# Patient Record
Sex: Female | Born: 1985 | Race: Black or African American | Hispanic: No | Marital: Single | Smoking: Current some day smoker
Health system: Southern US, Community
[De-identification: ages and names within clinical notes are randomized; demographics above are authoritative.]

## PROBLEM LIST (undated history)

## (undated) DIAGNOSIS — N83209 Unspecified ovarian cyst, unspecified side: Secondary | ICD-10-CM

## (undated) DIAGNOSIS — F419 Anxiety disorder, unspecified: Secondary | ICD-10-CM

## (undated) DIAGNOSIS — R42 Dizziness and giddiness: Secondary | ICD-10-CM

## (undated) HISTORY — PX: TUBAL LIGATION: SHX77

## (undated) HISTORY — PX: APPENDECTOMY: SHX54

## (undated) HISTORY — PX: MYOMECTOMY: SHX85

---

## 2009-01-03 ENCOUNTER — Inpatient Hospital Stay (HOSPITAL_COMMUNITY): Admission: AD | Admit: 2009-01-03 | Discharge: 2009-01-03 | Payer: Self-pay | Admitting: Obstetrics & Gynecology

## 2009-01-04 ENCOUNTER — Ambulatory Visit (HOSPITAL_COMMUNITY): Admission: RE | Admit: 2009-01-04 | Discharge: 2009-01-04 | Payer: Self-pay | Admitting: Obstetrics & Gynecology

## 2009-01-04 ENCOUNTER — Ambulatory Visit: Payer: Self-pay | Admitting: Obstetrics & Gynecology

## 2009-01-12 ENCOUNTER — Ambulatory Visit: Payer: Self-pay | Admitting: Family Medicine

## 2009-01-19 ENCOUNTER — Ambulatory Visit: Payer: Self-pay | Admitting: Family Medicine

## 2009-01-24 ENCOUNTER — Inpatient Hospital Stay (HOSPITAL_COMMUNITY): Admission: AD | Admit: 2009-01-24 | Discharge: 2009-01-24 | Payer: Self-pay | Admitting: Obstetrics & Gynecology

## 2009-01-26 ENCOUNTER — Ambulatory Visit: Payer: Self-pay | Admitting: Family Medicine

## 2009-01-26 ENCOUNTER — Ambulatory Visit (HOSPITAL_COMMUNITY): Admission: RE | Admit: 2009-01-26 | Discharge: 2009-01-26 | Payer: Self-pay | Admitting: Obstetrics and Gynecology

## 2009-02-02 ENCOUNTER — Ambulatory Visit: Payer: Self-pay | Admitting: Family Medicine

## 2009-02-09 ENCOUNTER — Ambulatory Visit: Payer: Self-pay | Admitting: Obstetrics & Gynecology

## 2009-02-09 ENCOUNTER — Ambulatory Visit (HOSPITAL_COMMUNITY): Admission: RE | Admit: 2009-02-09 | Discharge: 2009-02-09 | Payer: Self-pay | Admitting: Family Medicine

## 2009-02-16 ENCOUNTER — Ambulatory Visit: Payer: Self-pay | Admitting: Obstetrics and Gynecology

## 2009-02-23 ENCOUNTER — Ambulatory Visit: Payer: Self-pay | Admitting: Obstetrics & Gynecology

## 2009-03-02 ENCOUNTER — Ambulatory Visit: Payer: Self-pay | Admitting: Obstetrics & Gynecology

## 2009-03-04 ENCOUNTER — Inpatient Hospital Stay (HOSPITAL_COMMUNITY): Admission: AD | Admit: 2009-03-04 | Discharge: 2009-03-04 | Payer: Self-pay | Admitting: Obstetrics and Gynecology

## 2009-03-04 ENCOUNTER — Ambulatory Visit: Payer: Self-pay | Admitting: Family

## 2009-03-09 ENCOUNTER — Ambulatory Visit (HOSPITAL_COMMUNITY): Admission: RE | Admit: 2009-03-09 | Discharge: 2009-03-09 | Payer: Self-pay | Admitting: Obstetrics & Gynecology

## 2009-03-09 ENCOUNTER — Ambulatory Visit: Payer: Self-pay | Admitting: Obstetrics and Gynecology

## 2009-03-16 ENCOUNTER — Ambulatory Visit: Payer: Self-pay | Admitting: Obstetrics & Gynecology

## 2009-03-16 ENCOUNTER — Encounter: Payer: Self-pay | Admitting: Family Medicine

## 2009-03-23 ENCOUNTER — Ambulatory Visit: Payer: Self-pay | Admitting: Obstetrics & Gynecology

## 2009-03-30 ENCOUNTER — Ambulatory Visit: Payer: Self-pay | Admitting: Obstetrics & Gynecology

## 2009-03-30 LAB — CONVERTED CEMR LAB
HCT: 35.4 % — ABNORMAL LOW (ref 36.0–46.0)
MCV: 91.7 fL (ref 78.0–100.0)
Platelets: 313 10*3/uL (ref 150–400)
WBC: 8 10*3/uL (ref 4.0–10.5)

## 2009-04-06 ENCOUNTER — Ambulatory Visit: Payer: Self-pay | Admitting: Obstetrics & Gynecology

## 2009-04-13 ENCOUNTER — Ambulatory Visit: Payer: Self-pay | Admitting: Obstetrics & Gynecology

## 2009-04-14 ENCOUNTER — Inpatient Hospital Stay (HOSPITAL_COMMUNITY): Admission: AD | Admit: 2009-04-14 | Discharge: 2009-04-14 | Payer: Self-pay | Admitting: Obstetrics & Gynecology

## 2009-04-20 ENCOUNTER — Ambulatory Visit: Payer: Self-pay | Admitting: Obstetrics & Gynecology

## 2009-04-27 ENCOUNTER — Ambulatory Visit: Payer: Self-pay | Admitting: Obstetrics & Gynecology

## 2009-05-04 ENCOUNTER — Encounter: Payer: Self-pay | Admitting: Obstetrics & Gynecology

## 2009-05-04 ENCOUNTER — Ambulatory Visit: Payer: Self-pay | Admitting: Obstetrics & Gynecology

## 2009-05-04 LAB — CONVERTED CEMR LAB
Alkaline Phosphatase: 146 units/L — ABNORMAL HIGH (ref 39–117)
BUN: 5 mg/dL — ABNORMAL LOW (ref 6–23)
Glucose, Bld: 94 mg/dL (ref 70–99)
MCHC: 33.4 g/dL (ref 30.0–36.0)
MCV: 88 fL (ref 78.0–100.0)
RBC: 3.74 M/uL — ABNORMAL LOW (ref 3.87–5.11)
Sodium: 136 meq/L (ref 135–145)
Total Bilirubin: 0.2 mg/dL — ABNORMAL LOW (ref 0.3–1.2)
Total Protein: 5.8 g/dL — ABNORMAL LOW (ref 6.0–8.3)

## 2009-05-05 ENCOUNTER — Ambulatory Visit: Payer: Self-pay | Admitting: Obstetrics & Gynecology

## 2009-05-05 LAB — CONVERTED CEMR LAB
Collection Interval-CRCL: 24 hr
Creatinine 24 HR UR: 989 mg/24hr (ref 700–1800)
Creatinine Clearance: 196 mL/min — ABNORMAL HIGH (ref 75–115)
Creatinine, Urine: 60.9 mg/dL
Protein, Ur: 81 mg/24hr (ref 50–100)

## 2009-05-08 ENCOUNTER — Ambulatory Visit: Payer: Self-pay | Admitting: Obstetrics & Gynecology

## 2009-05-11 ENCOUNTER — Ambulatory Visit: Payer: Self-pay | Admitting: Family Medicine

## 2009-05-18 ENCOUNTER — Ambulatory Visit: Payer: Self-pay | Admitting: Obstetrics & Gynecology

## 2009-05-24 ENCOUNTER — Ambulatory Visit: Payer: Self-pay | Admitting: Obstetrics and Gynecology

## 2009-05-24 ENCOUNTER — Inpatient Hospital Stay (HOSPITAL_COMMUNITY): Admission: AD | Admit: 2009-05-24 | Discharge: 2009-05-24 | Payer: Self-pay | Admitting: Obstetrics & Gynecology

## 2009-05-30 ENCOUNTER — Ambulatory Visit: Payer: Self-pay | Admitting: Physician Assistant

## 2009-05-31 ENCOUNTER — Inpatient Hospital Stay (HOSPITAL_COMMUNITY): Admission: AD | Admit: 2009-05-31 | Discharge: 2009-06-02 | Payer: Self-pay | Admitting: Obstetrics & Gynecology

## 2009-06-04 ENCOUNTER — Ambulatory Visit: Payer: Self-pay | Admitting: Obstetrics and Gynecology

## 2009-06-04 ENCOUNTER — Inpatient Hospital Stay (HOSPITAL_COMMUNITY): Admission: AD | Admit: 2009-06-04 | Discharge: 2009-06-04 | Payer: Self-pay | Admitting: Obstetrics and Gynecology

## 2009-07-05 ENCOUNTER — Ambulatory Visit: Payer: Self-pay | Admitting: Obstetrics and Gynecology

## 2010-10-27 ENCOUNTER — Emergency Department (HOSPITAL_BASED_OUTPATIENT_CLINIC_OR_DEPARTMENT_OTHER)
Admission: EM | Admit: 2010-10-27 | Discharge: 2010-10-27 | Disposition: A | Discharge status: Home / Self Care | Payer: Medicaid Other | Attending: Emergency Medicine | Admitting: Emergency Medicine

## 2010-10-27 DIAGNOSIS — M549 Dorsalgia, unspecified: Secondary | ICD-10-CM | POA: Insufficient documentation

## 2010-10-27 DIAGNOSIS — N390 Urinary tract infection, site not specified: Secondary | ICD-10-CM | POA: Insufficient documentation

## 2010-10-27 DIAGNOSIS — A5901 Trichomonal vulvovaginitis: Secondary | ICD-10-CM | POA: Insufficient documentation

## 2010-10-27 DIAGNOSIS — F17200 Nicotine dependence, unspecified, uncomplicated: Secondary | ICD-10-CM | POA: Insufficient documentation

## 2010-10-27 LAB — URINALYSIS, ROUTINE W REFLEX MICROSCOPIC
Bilirubin Urine: NEGATIVE
Ketones, ur: NEGATIVE mg/dL
Protein, ur: NEGATIVE mg/dL
Urobilinogen, UA: 0.2 mg/dL (ref 0.0–1.0)

## 2010-10-27 LAB — WET PREP, GENITAL: Yeast Wet Prep HPF POC: NONE SEEN

## 2010-10-28 ENCOUNTER — Emergency Department (HOSPITAL_BASED_OUTPATIENT_CLINIC_OR_DEPARTMENT_OTHER)
Admission: EM | Admit: 2010-10-28 | Discharge: 2010-10-28 | Disposition: A | Discharge status: Home / Self Care | Payer: Medicaid Other | Attending: Emergency Medicine | Admitting: Emergency Medicine

## 2010-10-28 DIAGNOSIS — N72 Inflammatory disease of cervix uteri: Secondary | ICD-10-CM | POA: Insufficient documentation

## 2010-10-28 DIAGNOSIS — M545 Low back pain, unspecified: Secondary | ICD-10-CM | POA: Insufficient documentation

## 2010-10-28 DIAGNOSIS — A6000 Herpesviral infection of urogenital system, unspecified: Secondary | ICD-10-CM | POA: Insufficient documentation

## 2010-10-28 DIAGNOSIS — F17200 Nicotine dependence, unspecified, uncomplicated: Secondary | ICD-10-CM | POA: Insufficient documentation

## 2010-10-29 LAB — GC/CHLAMYDIA PROBE AMP, GENITAL: GC Probe Amp, Genital: NEGATIVE

## 2010-12-14 LAB — CBC
HCT: 30.6 % — ABNORMAL LOW (ref 36.0–46.0)
Hemoglobin: 10.2 g/dL — ABNORMAL LOW (ref 12.0–15.0)
MCHC: 33.5 g/dL (ref 30.0–36.0)
MCV: 89.2 fL (ref 78.0–100.0)
RBC: 3.42 MIL/uL — ABNORMAL LOW (ref 3.87–5.11)
RDW: 12.8 % (ref 11.5–15.5)
RDW: 13.4 % (ref 11.5–15.5)

## 2010-12-14 LAB — URINALYSIS, ROUTINE W REFLEX MICROSCOPIC
Bilirubin Urine: NEGATIVE
Bilirubin Urine: NEGATIVE
Glucose, UA: NEGATIVE mg/dL
Glucose, UA: NEGATIVE mg/dL
Hgb urine dipstick: NEGATIVE
Ketones, ur: NEGATIVE mg/dL
Ketones, ur: NEGATIVE mg/dL
Leukocytes, UA: NEGATIVE
Protein, ur: 30 mg/dL — AB
Specific Gravity, Urine: 1.015 (ref 1.005–1.030)
pH: >9 — ABNORMAL HIGH (ref 5.0–8.0)

## 2010-12-14 LAB — AST: AST: 22 U/L (ref 0–37)

## 2010-12-14 LAB — POCT URINALYSIS DIP (DEVICE)
Hgb urine dipstick: NEGATIVE
Ketones, ur: NEGATIVE mg/dL
Protein, ur: NEGATIVE mg/dL
Specific Gravity, Urine: 1.01 (ref 1.005–1.030)
pH: 8.5 — ABNORMAL HIGH (ref 5.0–8.0)

## 2010-12-14 LAB — RPR: RPR Ser Ql: NONREACTIVE

## 2010-12-15 LAB — URINE MICROSCOPIC-ADD ON

## 2010-12-15 LAB — URINALYSIS, ROUTINE W REFLEX MICROSCOPIC
Bilirubin Urine: NEGATIVE
Hgb urine dipstick: NEGATIVE
Specific Gravity, Urine: 1.025 (ref 1.005–1.030)
Urobilinogen, UA: 0.2 mg/dL (ref 0.0–1.0)
pH: 6 (ref 5.0–8.0)

## 2010-12-15 LAB — POCT URINALYSIS DIP (DEVICE)
Bilirubin Urine: NEGATIVE
Bilirubin Urine: NEGATIVE
Hgb urine dipstick: NEGATIVE
Hgb urine dipstick: NEGATIVE
Ketones, ur: NEGATIVE mg/dL
Ketones, ur: NEGATIVE mg/dL
Protein, ur: 30 mg/dL — AB
Specific Gravity, Urine: 1.015 (ref 1.005–1.030)
pH: 8.5 — ABNORMAL HIGH (ref 5.0–8.0)
pH: 6.5 (ref 5.0–8.0)

## 2010-12-16 LAB — POCT URINALYSIS DIP (DEVICE)
Bilirubin Urine: NEGATIVE
Hgb urine dipstick: NEGATIVE
Hgb urine dipstick: NEGATIVE
Ketones, ur: NEGATIVE mg/dL
Nitrite: NEGATIVE
Protein, ur: NEGATIVE mg/dL
Protein, ur: NEGATIVE mg/dL
Specific Gravity, Urine: 1.02 (ref 1.005–1.030)
Specific Gravity, Urine: <=1.005 (ref 1.005–1.030)
Urobilinogen, UA: 0.2 mg/dL (ref 0.0–1.0)
Urobilinogen, UA: 1 mg/dL (ref 0.0–1.0)
pH: 5 (ref 5.0–8.0)
pH: 7 (ref 5.0–8.0)

## 2010-12-17 LAB — POCT URINALYSIS DIP (DEVICE)
Bilirubin Urine: NEGATIVE
Glucose, UA: NEGATIVE mg/dL
Hgb urine dipstick: NEGATIVE
Ketones, ur: NEGATIVE mg/dL
Nitrite: NEGATIVE
Protein, ur: NEGATIVE mg/dL
Protein, ur: NEGATIVE mg/dL
Specific Gravity, Urine: 1.015 (ref 1.005–1.030)
Urobilinogen, UA: 0.2 mg/dL (ref 0.0–1.0)
Urobilinogen, UA: 0.2 mg/dL (ref 0.0–1.0)
pH: 6 (ref 5.0–8.0)
pH: 8 (ref 5.0–8.0)

## 2010-12-17 LAB — URINALYSIS, ROUTINE W REFLEX MICROSCOPIC
Bilirubin Urine: NEGATIVE
Glucose, UA: NEGATIVE mg/dL
Hgb urine dipstick: NEGATIVE
Ketones, ur: NEGATIVE mg/dL
Protein, ur: NEGATIVE mg/dL

## 2010-12-17 LAB — GC/CHLAMYDIA PROBE AMP, GENITAL
Chlamydia, DNA Probe: NEGATIVE
GC Probe Amp, Genital: NEGATIVE

## 2010-12-17 LAB — WET PREP, GENITAL

## 2010-12-18 LAB — POCT URINALYSIS DIP (DEVICE)
Bilirubin Urine: NEGATIVE
Bilirubin Urine: NEGATIVE
Bilirubin Urine: NEGATIVE
Glucose, UA: NEGATIVE mg/dL
Glucose, UA: NEGATIVE mg/dL
Hgb urine dipstick: NEGATIVE
Ketones, ur: NEGATIVE mg/dL
Ketones, ur: NEGATIVE mg/dL
Ketones, ur: NEGATIVE mg/dL
Nitrite: NEGATIVE
Protein, ur: NEGATIVE mg/dL
Specific Gravity, Urine: 1.015 (ref 1.005–1.030)
Specific Gravity, Urine: 1.015 (ref 1.005–1.030)
pH: 7 (ref 5.0–8.0)

## 2010-12-18 LAB — CBC
MCHC: 34.6 g/dL (ref 30.0–36.0)
Platelets: 279 10*3/uL (ref 150–400)
RBC: 3.96 MIL/uL (ref 3.87–5.11)
WBC: 8.5 10*3/uL (ref 4.0–10.5)

## 2010-12-18 LAB — URINALYSIS, ROUTINE W REFLEX MICROSCOPIC
Nitrite: NEGATIVE
Protein, ur: NEGATIVE mg/dL
Specific Gravity, Urine: 1.02 (ref 1.005–1.030)
Urobilinogen, UA: 0.2 mg/dL (ref 0.0–1.0)

## 2010-12-18 LAB — DIFFERENTIAL
Basophils Relative: 0 % (ref 0–1)
Eosinophils Absolute: 0.1 10*3/uL (ref 0.0–0.7)
Lymphs Abs: 0.9 10*3/uL (ref 0.7–4.0)
Monocytes Relative: 10 % (ref 3–12)
Neutro Abs: 6.6 10*3/uL (ref 1.7–7.7)
Neutrophils Relative %: 78 % — ABNORMAL HIGH (ref 43–77)

## 2010-12-19 LAB — URINALYSIS, ROUTINE W REFLEX MICROSCOPIC
Glucose, UA: NEGATIVE mg/dL
Hgb urine dipstick: NEGATIVE
Protein, ur: NEGATIVE mg/dL
Specific Gravity, Urine: <1.005 — ABNORMAL LOW (ref 1.005–1.030)
pH: 5.5 (ref 5.0–8.0)

## 2010-12-19 LAB — URINE MICROSCOPIC-ADD ON
RBC / HPF: NONE SEEN RBC/hpf (ref <3)
WBC, UA: NONE SEEN WBC/hpf (ref <3)

## 2010-12-19 LAB — POCT URINALYSIS DIP (DEVICE)
Glucose, UA: NEGATIVE mg/dL
Hgb urine dipstick: NEGATIVE
Nitrite: NEGATIVE
Urobilinogen, UA: 0.2 mg/dL (ref 0.0–1.0)
pH: 5.5 (ref 5.0–8.0)

## 2010-12-19 LAB — WET PREP, GENITAL
Clue Cells Wet Prep HPF POC: NONE SEEN
Trich, Wet Prep: NONE SEEN
Yeast Wet Prep HPF POC: NONE SEEN

## 2010-12-19 LAB — URINE CULTURE: Culture: NO GROWTH

## 2011-05-06 ENCOUNTER — Inpatient Hospital Stay (HOSPITAL_COMMUNITY)
Admission: AD | Admit: 2011-05-06 | Discharge: 2011-05-07 | Disposition: A | Discharge status: Home / Self Care | Payer: Medicaid Other | Source: Ambulatory Visit | Attending: Obstetrics and Gynecology | Admitting: Obstetrics and Gynecology

## 2011-05-06 ENCOUNTER — Inpatient Hospital Stay (HOSPITAL_COMMUNITY): Payer: Medicaid Other

## 2011-05-06 ENCOUNTER — Encounter (HOSPITAL_COMMUNITY): Payer: Self-pay | Admitting: *Deleted

## 2011-05-06 DIAGNOSIS — N760 Acute vaginitis: Secondary | ICD-10-CM | POA: Insufficient documentation

## 2011-05-06 DIAGNOSIS — N83209 Unspecified ovarian cyst, unspecified side: Secondary | ICD-10-CM | POA: Insufficient documentation

## 2011-05-06 DIAGNOSIS — A499 Bacterial infection, unspecified: Secondary | ICD-10-CM | POA: Insufficient documentation

## 2011-05-06 DIAGNOSIS — B9689 Other specified bacterial agents as the cause of diseases classified elsewhere: Secondary | ICD-10-CM | POA: Insufficient documentation

## 2011-05-06 DIAGNOSIS — M545 Low back pain, unspecified: Secondary | ICD-10-CM | POA: Insufficient documentation

## 2011-05-06 HISTORY — DX: Unspecified ovarian cyst, unspecified side: N83.209

## 2011-05-06 LAB — URINALYSIS, ROUTINE W REFLEX MICROSCOPIC
Bilirubin Urine: NEGATIVE
Hgb urine dipstick: NEGATIVE
Nitrite: NEGATIVE
Protein, ur: NEGATIVE mg/dL
Urobilinogen, UA: 0.2 mg/dL (ref 0.0–1.0)

## 2011-05-06 LAB — CBC
HCT: 43.1 % (ref 36.0–46.0)
MCHC: 33.9 g/dL (ref 30.0–36.0)
MCV: 92.9 fL (ref 78.0–100.0)
RDW: 12.6 % (ref 11.5–15.5)

## 2011-05-06 LAB — WET PREP, GENITAL
Trich, Wet Prep: NONE SEEN
Yeast Wet Prep HPF POC: NONE SEEN

## 2011-05-06 MED ORDER — KETOROLAC TROMETHAMINE 10 MG PO TABS: 10.0000 mg | ORAL_TABLET | Freq: Four times a day (QID) | ORAL | Status: AC | PRN

## 2011-05-06 MED ORDER — METRONIDAZOLE 500 MG PO TABS: 500.0000 mg | ORAL_TABLET | Freq: Three times a day (TID) | ORAL | Status: AC

## 2011-05-06 MED ORDER — OXYCODONE-ACETAMINOPHEN 5-325 MG PO TABS
2.0000 | ORAL_TABLET | Freq: Once | ORAL | Status: AC
  Administered 2011-05-06: 2 via ORAL
  Filled 2011-05-06: qty 2

## 2011-05-06 MED ORDER — OXYCODONE-ACETAMINOPHEN 5-325 MG PO TABS: 1.0000 | ORAL_TABLET | ORAL | Status: AC | PRN

## 2011-05-06 MED ORDER — KETOROLAC TROMETHAMINE 60 MG/2ML IM SOLN
60.0000 mg | Freq: Once | INTRAMUSCULAR | Status: AC
  Administered 2011-05-06: 60 mg via INTRAMUSCULAR
  Filled 2011-05-06: qty 2

## 2011-05-06 NOTE — Progress Notes (Signed)
EMS arrival, cannot move due to pain, walking and sudden onset of back pain, hx ovarian cyst, ? Onset menses 0900 then stopped, tubal 2010, lower back tender to palpation, taken to br via stretcher for Urine, allergic to lub so did not want to cath.  Able to ambulate with assistance

## 2011-05-06 NOTE — ED Provider Notes (Signed)
History   Sudden onset of bilat LBP that radiated upward, suprapubic pain, hot flashes and vomiting this evening. Family called EMS because pt couldn't move and stated she was in severe pain 10/10. LMP 04/16/11. But had mod VB x a few hours today that stopped. No recent IC. Hx ovarian cysts. Similar Sx, but different location of pain. Has not taken anything for pain.  CSN: [id number] Arrival date & time: 05/06/2011  7:49 PM  Chief Complaint  Patient presents with  . Back Pain   HPI  Past Medical History  Diagnosis Date  . Ovarian cyst     Past Surgical History  Procedure Date  . Appendectomy   . Myomectomy   . Tubal ligation     No family history on file.  History  Substance Use Topics  . Smoking status: Current Everyday Smoker -- 0.2 packs/day    Types: Cigarettes, Pipe  . Smokeless tobacco: Not on file  . Alcohol Use: No    OB History    Grav Para Term Preterm Abortions TAB SAB Ect Mult Living   5 4 2 2 1  1   2       Review of Systems  All other systems reviewed and are negative.    Physical Exam  BP 96/66  Pulse 67  Temp(Src) 98.7 F (37.1 C) (Oral)  Resp 20  LMP 04/16/2011  Physical Exam  Constitutional: She is oriented to person, place, and time. She appears well-developed and well-nourished. She appears distressed (curled up in fetal position. Great difficulty w/ ambulation on arrival.).  Eyes: Pupils are equal, round, and reactive to light.  Cardiovascular: Normal rate.   Pulmonary/Chest: Effort normal.  Abdominal: Soft. Bowel sounds are normal. She exhibits no distension and no mass. There is tenderness (mod suprapubic tenderness). There is no rebound and no guarding.  Genitourinary: Uterus is tender (mild). Uterus is not enlarged. Cervix exhibits friability (at transformation zone, ? small polyp). Cervix exhibits no motion tenderness and no discharge. Right adnexum displays no mass, no tenderness and no fullness. Left adnexum displays no mass, no  tenderness and no fullness. There is bleeding (scant, tan VB, malodorous) around the vagina.  Musculoskeletal: Normal range of motion.       Lumbar back: She exhibits tenderness (mild).  Neurological: She is alert and oriented to person, place, and time. She has normal strength and normal reflexes. A sensory deficit (decreased sensation over sacrum) is present. She exhibits normal muscle tone. Gait normal.   Results for orders placed during the hospital encounter of 05/06/11 (from the past 24 hour(s))  URINALYSIS, ROUTINE W REFLEX MICROSCOPIC     Status: Normal   Collection Time   05/06/11  8:01 PM      Component Value Range   Color, Urine YELLOW  YELLOW    Appearance CLEAR  CLEAR    Specific Gravity, Urine 1.010  1.005 - 1.030    pH 5.5  5.0 - 8.0    Glucose, UA NEGATIVE  NEGATIVE (mg/dL)   Hgb urine dipstick NEGATIVE  NEGATIVE    Bilirubin Urine NEGATIVE  NEGATIVE    Ketones, ur NEGATIVE  NEGATIVE (mg/dL)   Protein, ur NEGATIVE  NEGATIVE (mg/dL)   Urobilinogen, UA 0.2  0.0 - 1.0 (mg/dL)   Nitrite NEGATIVE  NEGATIVE    Leukocytes, UA NEGATIVE  NEGATIVE   POCT PREGNANCY, URINE     Status: Normal   Collection Time   05/06/11  8:02 PM      Component  Value Range   Preg Test, Ur NEGATIVE    CBC     Status: Normal   Collection Time   05/06/11  8:52 PM      Component Value Range   WBC 8.1  4.0 - 10.5 (K/uL)   RBC 4.64  3.87 - 5.11 (MIL/uL)   Hemoglobin 14.6  12.0 - 15.0 (g/dL)   HCT 16.1  09.6 - 04.5 (%)   MCV 92.9  78.0 - 100.0 (fL)   MCH 31.5  26.0 - 34.0 (pg)   MCHC 33.9  30.0 - 36.0 (g/dL)   RDW 40.9  81.1 - 91.4 (%)   Platelets 368  150 - 400 (K/uL)  WET PREP, GENITAL     Status: Abnormal   Collection Time   05/06/11 10:25 PM      Component Value Range   Yeast, Wet Prep NONE SEEN  NONE SEEN    Trich, Wet Prep NONE SEEN  NONE SEEN    Clue Cells, Wet Prep FEW (*) NONE SEEN    WBC, Wet Prep HPF POC MANY (*) NONE SEEN   US Transvaginal Non-ob  05/06/2011  *RADIOLOGY REPORT*   Clinical Data: Pelvic pain.  TRANSABDOMINAL AND TRANSVAGINAL ULTRASOUND OF PELVIS Technique:  Both transabdominal and transvaginal ultrasound examinations of the pelvis were performed. Transabdominal technique was performed for global imaging of the pelvis including uterus, ovaries, adnexal regions, and pelvic cul-de-sac.  Comparison: 03/09/2009   It was necessary to proceed with endovaginal exam following the transabdominal exam to visualize the endometrium and adnexa.  Findings:  Uterus: The uterus is normal in size and appearance, measuring 6.8 x 4.4 x 5.5 cm.  Endometrium: Normal in thickness, measuring 9 mm, contains a trace amount of fluid.  Right ovary:  Normal in size and echogenicity, measures 2.6 x 1.7 x 2.3 cm.  Left ovary: Normal in size and echogenicity, measures 3.50 1.1 x 2.2 cm.  Other findings: Trace free fluid.  IMPRESSION: No evidence of pelvic mass or other significant abnormality.  Trace free fluid is likely physiologic.  Original Report Authenticated By: Waneta Martins, M.D.   US Pelvis Complete  05/06/2011  *RADIOLOGY REPORT*  Clinical Data: Pelvic pain.  TRANSABDOMINAL AND TRANSVAGINAL ULTRASOUND OF PELVIS Technique:  Both transabdominal and transvaginal ultrasound examinations of the pelvis were performed. Transabdominal technique was performed for global imaging of the pelvis including uterus, ovaries, adnexal regions, and pelvic cul-de-sac.  Comparison: 03/09/2009   It was necessary to proceed with endovaginal exam following the transabdominal exam to visualize the endometrium and adnexa.  Findings:  Uterus: The uterus is normal in size and appearance, measuring 6.8 x 4.4 x 5.5 cm.  Endometrium: Normal in thickness, measuring 9 mm, contains a trace amount of fluid.  Right ovary:  Normal in size and echogenicity, measures 2.6 x 1.7 x 2.3 cm.  Left ovary: Normal in size and echogenicity, measures 3.50 1.1 x 2.2 cm.  Other findings: Trace free fluid.  IMPRESSION: No evidence of pelvic  mass or other significant abnormality.  Trace free fluid is likely physiologic.  Original Report Authenticated By: Waneta Martins, M.D.   ED Course  Procedures Assessment: 1. Possible rupture ovarian cyst, pain significantly improved w/ pain meds 2. LBP of unk etiology 3. BV  Plan: 1. Per consult w/ Dr. Jolayne Panther, offered pt D/C home vs transfer to [facility] for further eval of LBP. Pt chooses D/C 2. F/U PRN w/ PCP or orthopedist.  3. Rx Percocet 5/325 #30, Toradol and  Flagyl 4. Contact info given for [facility]

## 2011-05-07 LAB — GC/CHLAMYDIA PROBE AMP, GENITAL: Chlamydia, DNA Probe: NEGATIVE

## 2011-05-09 NOTE — ED Provider Notes (Signed)
Agree with above note.  CONSTANT,PEGGY 05/09/2011 10:43 AM

## 2011-07-03 ENCOUNTER — Emergency Department (HOSPITAL_BASED_OUTPATIENT_CLINIC_OR_DEPARTMENT_OTHER)
Admission: EM | Admit: 2011-07-03 | Discharge: 2011-07-03 | Disposition: A | Discharge status: Home / Self Care | Payer: Medicaid Other | Attending: Emergency Medicine | Admitting: Emergency Medicine

## 2011-07-03 ENCOUNTER — Encounter (HOSPITAL_BASED_OUTPATIENT_CLINIC_OR_DEPARTMENT_OTHER): Payer: Self-pay | Admitting: *Deleted

## 2011-07-03 DIAGNOSIS — F17200 Nicotine dependence, unspecified, uncomplicated: Secondary | ICD-10-CM | POA: Insufficient documentation

## 2011-07-03 DIAGNOSIS — S60569A Insect bite (nonvenomous) of unspecified hand, initial encounter: Secondary | ICD-10-CM | POA: Insufficient documentation

## 2011-07-03 DIAGNOSIS — Y9289 Other specified places as the place of occurrence of the external cause: Secondary | ICD-10-CM | POA: Insufficient documentation

## 2011-07-03 DIAGNOSIS — W57XXXA Bitten or stung by nonvenomous insect and other nonvenomous arthropods, initial encounter: Secondary | ICD-10-CM

## 2011-07-03 MED ORDER — DIPHENHYDRAMINE HCL 50 MG/ML IJ SOLN
25.0000 mg | Freq: Once | INTRAMUSCULAR | Status: DC
  Filled 2011-07-03: qty 1

## 2011-07-03 MED ORDER — METHYLPREDNISOLONE SODIUM SUCC 125 MG IJ SOLR
125.0000 mg | Freq: Once | INTRAMUSCULAR | Status: AC
  Administered 2011-07-03: 125 mg via INTRAMUSCULAR
  Filled 2011-07-03: qty 2

## 2011-07-03 NOTE — ED Notes (Signed)
Pt amb to room 4 with quick steady gait in nad. Pt reports being bitten by spider on her right hand last night, also has 3 other red spots on arm.

## 2011-07-03 NOTE — ED Provider Notes (Signed)
History     CSN: [id number] Arrival date & time: 07/03/2011 12:49 PM   First MD Initiated Contact with Patient 07/03/11 1240      Chief Complaint  Patient presents with  . Insect Bite    (Consider location/radiation/quality/duration/timing/severity/associated sxs/prior treatment) HPI Comments: Pt states that she was bit by something last night and now she has red spots on her hand and arm:pt denies using any medication:pt states that she is unsure of what the insect was that bit her:pt states that the initial bite was on the forearm  The history is provided by the patient. No language interpreter was used.    Past Medical History  Diagnosis Date  . Ovarian cyst     Past Surgical History  Procedure Date  . Appendectomy   . Myomectomy   . Tubal ligation     History reviewed. No pertinent family history.  History  Substance Use Topics  . Smoking status: Current Everyday Smoker -- 0.2 packs/day    Types: Cigarettes, Pipe  . Smokeless tobacco: Not on file  . Alcohol Use: No    OB History    Grav Para Term Preterm Abortions TAB SAB Ect Mult Living   5 4 2 2 1  1   2       Review of Systems  Constitutional: Negative.   Respiratory: Negative.   Cardiovascular: Negative.   Skin: Positive for rash.    Allergies  Aspirin  Home Medications   Current Outpatient Rx  Name Route Sig Dispense Refill  . ALBUTEROL SULFATE 1.25 MG/3ML IN NEBU Nebulization Take 1 ampule by nebulization every 6 (six) hours as needed.      . ONE-A-DAY VITACRAVES PO CHEW Oral Chew 1 tablet by mouth daily.        BP 124/82  Pulse 79  Temp(Src) 98.4 F (36.9 C) (Oral)  Resp 18  SpO2 100%  LMP 07/03/2011  Physical Exam  Nursing note and vitals reviewed. Constitutional: She appears well-developed and well-nourished.  HENT:  Head: Normocephalic and atraumatic.  Eyes: Pupils are equal, round, and reactive to light.  Neck: Normal range of motion. Neck supple.  Cardiovascular: Normal  rate and regular rhythm.   Pulmonary/Chest: Effort normal and breath sounds normal.  Musculoskeletal: Normal range of motion.  Neurological: She is alert.  Skin:       Pt has multiple red raised whelps to the right arm and hand:no drainage noted to the area    ED Course  Procedures (including critical care time)  Labs Reviewed - No data to display No results found.   1. Insect bite       MDM  Pt has what appears to be a localized reaction:pt treated here in the er:pt okay to continued benadryl at home   Medical screening examination/treatment/procedure(s) were performed by non-physician practitioner and as supervising physician I was immediately available for consultation/collaboration. Osvaldo Human, M.D.      Teressa Lower, NP 07/03/11 1332  Carleene Cooper III, MD 07/03/11 [phone]

## 2011-08-12 ENCOUNTER — Emergency Department (INDEPENDENT_AMBULATORY_CARE_PROVIDER_SITE_OTHER): Payer: No Typology Code available for payment source

## 2011-08-12 ENCOUNTER — Encounter (HOSPITAL_BASED_OUTPATIENT_CLINIC_OR_DEPARTMENT_OTHER): Payer: Self-pay

## 2011-08-12 ENCOUNTER — Emergency Department (HOSPITAL_BASED_OUTPATIENT_CLINIC_OR_DEPARTMENT_OTHER)
Admission: EM | Admit: 2011-08-12 | Discharge: 2011-08-12 | Disposition: A | Discharge status: Home / Self Care | Payer: No Typology Code available for payment source | Attending: Emergency Medicine | Admitting: Emergency Medicine

## 2011-08-12 DIAGNOSIS — M542 Cervicalgia: Secondary | ICD-10-CM

## 2011-08-12 DIAGNOSIS — Y92410 Unspecified street and highway as the place of occurrence of the external cause: Secondary | ICD-10-CM | POA: Insufficient documentation

## 2011-08-12 DIAGNOSIS — M549 Dorsalgia, unspecified: Secondary | ICD-10-CM | POA: Insufficient documentation

## 2011-08-12 DIAGNOSIS — F17200 Nicotine dependence, unspecified, uncomplicated: Secondary | ICD-10-CM | POA: Insufficient documentation

## 2011-08-12 MED ORDER — HYDROCODONE-ACETAMINOPHEN 5-325 MG PO TABS: 2.0000 | ORAL_TABLET | ORAL | Status: AC | PRN

## 2011-08-12 MED ORDER — IBUPROFEN 800 MG PO TABS: 800.0000 mg | ORAL_TABLET | Freq: Three times a day (TID) | ORAL | Status: AC

## 2011-08-12 NOTE — ED Provider Notes (Signed)
Medical screening examination/treatment/procedure(s) were performed by non-physician practitioner and as supervising physician I was immediately available for consultation/collaboration.   Stacey Maura, MD 08/12/11 2302 

## 2011-08-12 NOTE — ED Notes (Signed)
Patient is resting comfortably. 
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
## 2011-09-23 ENCOUNTER — Encounter (HOSPITAL_BASED_OUTPATIENT_CLINIC_OR_DEPARTMENT_OTHER): Payer: Self-pay | Admitting: Emergency Medicine

## 2011-09-23 ENCOUNTER — Emergency Department (INDEPENDENT_AMBULATORY_CARE_PROVIDER_SITE_OTHER): Payer: Self-pay

## 2011-09-23 ENCOUNTER — Emergency Department (HOSPITAL_BASED_OUTPATIENT_CLINIC_OR_DEPARTMENT_OTHER)
Admission: EM | Admit: 2011-09-23 | Discharge: 2011-09-24 | Disposition: A | Discharge status: Home / Self Care | Payer: Self-pay | Attending: Emergency Medicine | Admitting: Emergency Medicine

## 2011-09-23 DIAGNOSIS — R509 Fever, unspecified: Secondary | ICD-10-CM

## 2011-09-23 DIAGNOSIS — R0989 Other specified symptoms and signs involving the circulatory and respiratory systems: Secondary | ICD-10-CM

## 2011-09-23 DIAGNOSIS — R1110 Vomiting, unspecified: Secondary | ICD-10-CM | POA: Insufficient documentation

## 2011-09-23 DIAGNOSIS — B349 Viral infection, unspecified: Secondary | ICD-10-CM

## 2011-09-23 DIAGNOSIS — J069 Acute upper respiratory infection, unspecified: Secondary | ICD-10-CM | POA: Insufficient documentation

## 2011-09-23 DIAGNOSIS — F17200 Nicotine dependence, unspecified, uncomplicated: Secondary | ICD-10-CM | POA: Insufficient documentation

## 2011-09-23 DIAGNOSIS — R05 Cough: Secondary | ICD-10-CM

## 2011-09-23 DIAGNOSIS — B9789 Other viral agents as the cause of diseases classified elsewhere: Secondary | ICD-10-CM | POA: Insufficient documentation

## 2011-09-23 LAB — BASIC METABOLIC PANEL
BUN: 6 mg/dL (ref 6–23)
CO2: 25 mEq/L (ref 19–32)
Calcium: 9.5 mg/dL (ref 8.4–10.5)
Chloride: 102 mEq/L (ref 96–112)
Creatinine, Ser: 0.7 mg/dL (ref 0.50–1.10)
GFR calc Af Amer: >90 mL/min (ref >90)
GFR calc non Af Amer: >90 mL/min (ref >90)
Glucose, Bld: 104 mg/dL — ABNORMAL HIGH (ref 70–99)
Potassium: 3.4 mEq/L — ABNORMAL LOW (ref 3.5–5.1)
Sodium: 138 mEq/L (ref 135–145)

## 2011-09-23 LAB — URINE MICROSCOPIC-ADD ON

## 2011-09-23 LAB — URINALYSIS, ROUTINE W REFLEX MICROSCOPIC
Bilirubin Urine: NEGATIVE
Glucose, UA: NEGATIVE mg/dL
Specific Gravity, Urine: 1.027 (ref 1.005–1.030)

## 2011-09-23 LAB — CBC
HCT: 39.7 % (ref 36.0–46.0)
Hemoglobin: 13.3 g/dL (ref 12.0–15.0)
MCH: 30.9 pg (ref 26.0–34.0)
MCHC: 33.5 g/dL (ref 30.0–36.0)
MCV: 92.3 fL (ref 78.0–100.0)
RDW: 12.2 % (ref 11.5–15.5)

## 2011-09-23 LAB — DIFFERENTIAL
Basophils Relative: 0 % (ref 0–1)
Eosinophils Absolute: 0 10*3/uL (ref 0.0–0.7)
Lymphocytes Relative: 11 % — ABNORMAL LOW (ref 12–46)
Lymphs Abs: 0.8 10*3/uL (ref 0.7–4.0)
Neutro Abs: 5.7 10*3/uL (ref 1.7–7.7)

## 2011-09-23 LAB — PREGNANCY, URINE: Preg Test, Ur: NEGATIVE

## 2011-09-23 MED ORDER — ACETAMINOPHEN 325 MG PO TABS
650.0000 mg | ORAL_TABLET | Freq: Once | ORAL | Status: AC
  Administered 2011-09-23: 650 mg via ORAL
  Filled 2011-09-23: qty 2

## 2011-09-23 MED ORDER — SODIUM CHLORIDE 0.9 % IV BOLUS (SEPSIS)
1000.0000 mL | Freq: Once | INTRAVENOUS | Status: AC
  Administered 2011-09-23: 1000 mL via INTRAVENOUS

## 2011-09-23 NOTE — ED Provider Notes (Signed)
History  This chart was scribed for Alicia Numbers, MD by Bennett Scrape. This patient was seen in room MH02/MH02 and the patient's care was started at 10:09PM.  CSN: [id number]  Arrival date & time 09/23/11  2039   First MD Initiated Contact with Patient 09/23/11 2154      Chief Complaint  Patient presents with  . Fever  . Cough  . Emesis   Patient is a 26 y.o. female presenting with fever. The history is provided by the patient. No language interpreter was used.  Fever Primary symptoms of the febrile illness include fever, headaches, cough, nausea, vomiting and myalgias. Primary symptoms do not include shortness of breath, abdominal pain, diarrhea, dysuria or rash. The current episode started 2 days ago. This is a new problem. The problem has been gradually worsening.  The fever began 2 days ago. The fever has been unchanged since its onset. The maximum temperature recorded prior to her arrival was unknown.  The headache is not associated with weakness.  The myalgias are not associated with weakness.    Alicia Brown is a 26 y.o. female who presents to the Emergency Department complaining of 2 days of gradual onset, gradually worsening, constant fever with associated sore throat,  productive cough of sputum, nasal congestion, myalgias, nausea, emesis X3 episodes today and increased frequency. Fever was measured 102.4 in the ED. She denies any modifying factors. She has been taking tylenol at home with no improvement in symptoms.  She denies abdominal pain and diarrhea as associated symptoms. She confirms sick contacts at home with similar symptoms. She did not get a flu shot this year. Pt is a smoker.   Past Medical History  Diagnosis Date  . Ovarian cyst     Past Surgical History  Procedure Date  . Appendectomy   . Myomectomy   . Tubal ligation     History reviewed. No pertinent family history.  History  Substance Use Topics  . Smoking status: Current Everyday Smoker --  0.2 packs/day    Types: Cigarettes  . Smokeless tobacco: Not on file  . Alcohol Use: No    OB History    Grav Para Term Preterm Abortions TAB SAB Ect Mult Living   5 4 2 2 1  1   2       Review of Systems  Constitutional: Positive for fever and chills.  HENT: Positive for congestion and sore throat. Negative for rhinorrhea and neck pain.   Eyes: Negative for itching.  Respiratory: Positive for cough. Negative for shortness of breath.   Cardiovascular: Negative for chest pain.  Gastrointestinal: Positive for nausea and vomiting. Negative for abdominal pain and diarrhea.  Genitourinary: Positive for frequency. Negative for dysuria and urgency.  Musculoskeletal: Positive for myalgias. Negative for back pain.  Skin: Negative for rash.  Neurological: Positive for headaches. Negative for weakness.    Allergies  Aspirin  Home Medications   Current Outpatient Rx  Name Route Sig Dispense Refill  . ALBUTEROL SULFATE 1.25 MG/3ML IN NEBU Nebulization Take 1 ampule by nebulization every 6 (six) hours as needed. For shortness of breath and wheezing    . MENTHOL (TOPICAL ANALGESIC) 5 % EX PADS Apply externally Apply 1 patch topically as needed. For pain     . ONE-A-DAY VITACRAVES PO CHEW Oral Chew 1 tablet by mouth daily.        Triage Vitals: BP 111/69  Pulse 125  Temp(Src) 102.4 F (39.1 C) (Oral)  Resp 18  SpO2 99%  Physical Exam  Nursing note and vitals reviewed. Constitutional: She is oriented to person, place, and time. She appears well-developed and well-nourished. No distress.  HENT:  Head: Normocephalic and atraumatic.  Right Ear: Tympanic membrane normal.  Left Ear: Tympanic membrane normal.  Nose: Mucosal edema present.  Mouth/Throat: Posterior oropharyngeal edema and posterior oropharyngeal erythema present. No oropharyngeal exudate.  Eyes: Conjunctivae and EOM are normal. Pupils are equal, round, and reactive to light.  Neck: Normal range of motion.    Cardiovascular: Normal rate, regular rhythm, normal heart sounds and intact distal pulses.  Exam reveals no gallop and no friction rub.   No murmur heard. Pulmonary/Chest: Effort normal. No respiratory distress. She has decreased breath sounds. She has no wheezes. She has no rales.  Abdominal: Soft. Bowel sounds are normal. She exhibits no distension. There is no tenderness. There is no rebound and no guarding.  Musculoskeletal: Normal range of motion.  Neurological: She is alert and oriented to person, place, and time. No cranial nerve deficit. She exhibits normal muscle tone. Coordination normal.  Skin: Skin is warm and dry. No rash noted.  Psychiatric: She has a normal mood and affect.    ED Course  Procedures (including critical care time)  DIAGNOSTIC STUDIES: Oxygen Saturation is 99% on room air, normal by my interpretation.    COORDINATION OF CARE: 10:13PM-Discussed chest x-ray and IV fluids with pt and pt agreed to plan.   Labs Reviewed  DIFFERENTIAL - Abnormal; Notable for the following:    Lymphocytes Relative 11 (*)    Monocytes Relative 16 (*)    Monocytes Absolute 1.2 (*)    All other components within normal limits  BASIC METABOLIC PANEL - Abnormal; Notable for the following:    Potassium 3.4 (*)    Glucose, Bld 104 (*)    All other components within normal limits  URINALYSIS, ROUTINE W REFLEX MICROSCOPIC - Abnormal; Notable for the following:    Color, Urine AMBER (*) BIOCHEMICALS MAY BE AFFECTED BY COLOR   APPearance CLOUDY (*)    Hgb urine dipstick LARGE (*)    Ketones, ur 15 (*)    Protein, ur 30 (*)    Nitrite POSITIVE (*)    Leukocytes, UA TRACE (*)    All other components within normal limits  URINE MICROSCOPIC-ADD ON - Abnormal; Notable for the following:    Squamous Epithelial / LPF FEW (*)    Bacteria, UA MANY (*)    All other components within normal limits  CBC  PREGNANCY, URINE   Dg Chest 2 View  09/23/2011  *RADIOLOGY REPORT*  Clinical Data:  Cough, congestion and fever for 3 days.  CHEST - 2 VIEW  Comparison: Thoracic spine radiographs performed 08/12/2011  Findings: The lungs are well-aerated and clear.  There is no evidence of focal opacification, pleural effusion or pneumothorax.  The heart is normal in size; the mediastinal contour is within normal limits.  No acute osseous abnormalities are seen.  IMPRESSION: No acute cardiopulmonary process seen.  Original Report Authenticated By: Tonia Ghent, M.D.     1. Acute URI   2. Viral disease       MDM  Patient was evaluated for her symptoms with labs and CXR.  There was no leukocytosis, renal insufficiency, or PNA found.  Paitent was rehydrated with IVF and treated for fever with tylenol and cough with tessalon perles.  Patient's vitals improved as did her symptoms.  She was given tamiflu and was discharged with prescription for this as  well as tessalon.  Patient was discharged in good condition.      I personally performed the services described in this documentation, which was scribed in my presence. The recorded information has been reviewed and considered.     Alicia Numbers, MD 09/25/11 [phone]

## 2011-09-23 NOTE — ED Notes (Signed)
Pt with cough, fever, n/v.

## 2011-09-24 MED ORDER — BENZONATATE 100 MG PO CAPS
200.0000 mg | ORAL_CAPSULE | Freq: Once | ORAL | Status: AC
  Administered 2011-09-24: 200 mg via ORAL
  Filled 2011-09-24: qty 2

## 2011-09-24 MED ORDER — BENZONATATE 200 MG PO CAPS: 200.0000 mg | ORAL_CAPSULE | Freq: Three times a day (TID) | ORAL | Status: AC | PRN

## 2011-09-24 MED ORDER — OSELTAMIVIR PHOSPHATE 75 MG PO CAPS
75.0000 mg | ORAL_CAPSULE | Freq: Once | ORAL | Status: AC
  Administered 2011-09-24: 75 mg via ORAL
  Filled 2011-09-24: qty 1

## 2011-09-24 MED ORDER — OSELTAMIVIR PHOSPHATE 75 MG PO CAPS: 75.0000 mg | ORAL_CAPSULE | Freq: Two times a day (BID) | ORAL | Status: AC

## 2012-06-25 ENCOUNTER — Emergency Department (HOSPITAL_BASED_OUTPATIENT_CLINIC_OR_DEPARTMENT_OTHER)
Admission: EM | Admit: 2012-06-25 | Discharge: 2012-06-25 | Disposition: A | Discharge status: Home / Self Care | Payer: Medicaid Other | Attending: Emergency Medicine | Admitting: Emergency Medicine

## 2012-06-25 ENCOUNTER — Encounter (HOSPITAL_BASED_OUTPATIENT_CLINIC_OR_DEPARTMENT_OTHER): Payer: Self-pay | Admitting: Emergency Medicine

## 2012-06-25 ENCOUNTER — Emergency Department (HOSPITAL_BASED_OUTPATIENT_CLINIC_OR_DEPARTMENT_OTHER): Payer: Medicaid Other

## 2012-06-25 DIAGNOSIS — X58XXXA Exposure to other specified factors, initial encounter: Secondary | ICD-10-CM | POA: Insufficient documentation

## 2012-06-25 DIAGNOSIS — IMO0002 Reserved for concepts with insufficient information to code with codable children: Secondary | ICD-10-CM | POA: Insufficient documentation

## 2012-06-25 DIAGNOSIS — F17200 Nicotine dependence, unspecified, uncomplicated: Secondary | ICD-10-CM | POA: Insufficient documentation

## 2012-06-25 DIAGNOSIS — S53409A Unspecified sprain of unspecified elbow, initial encounter: Secondary | ICD-10-CM

## 2012-06-25 MED ORDER — IBUPROFEN 400 MG PO TABS: 400.0000 mg | ORAL_TABLET | Freq: Four times a day (QID) | ORAL | Status: DC | PRN

## 2012-06-25 NOTE — ED Provider Notes (Signed)
History     CSN: [id number]  Arrival date & time 06/25/12  1816   First MD Initiated Contact with Patient 06/25/12 1926      Chief Complaint  Patient presents with  . Arm Pain    (Consider location/radiation/quality/duration/timing/severity/associated sxs/prior treatment) HPI Comments: This is a 26 year old female who presents to the ED with a chief complaint of left elbow pain x 2 days.  The patient has not taken anything for the pain.  The pain radiates throughout her arm if elbow is completely extended or flexed.  She is in mild discomfort.  The history is provided by the patient. No language interpreter was used.    Past Medical History  Diagnosis Date  . Ovarian cyst     Past Surgical History  Procedure Date  . Appendectomy   . Myomectomy   . Tubal ligation     No family history on file.  History  Substance Use Topics  . Smoking status: Current Every Day Smoker -- 0.2 packs/day    Types: Cigarettes  . Smokeless tobacco: Not on file  . Alcohol Use: No    OB History    Grav Para Term Preterm Abortions TAB SAB Ect Mult Living   5 4 2 2 1  1   2       Review of Systems  Constitutional: Negative for fever.  HENT: Negative for rhinorrhea.   Eyes: Negative for visual disturbance.  Respiratory: Negative for shortness of breath.   Cardiovascular: Negative for chest pain.  Gastrointestinal: Negative for abdominal pain.  Genitourinary: Negative for dysuria.  Musculoskeletal:       Left elbow pain  Skin: Negative for rash.  Neurological: Negative for weakness.  Psychiatric/Behavioral: The patient is not nervous/anxious.   All other systems reviewed and are negative.    Allergies  Aspirin  Home Medications   Current Outpatient Rx  Name Route Sig Dispense Refill  . ONE-A-DAY VITACRAVES PO CHEW Oral Chew 1 tablet by mouth daily.      . ALBUTEROL SULFATE 1.25 MG/3ML IN NEBU Nebulization Take 1 ampule by nebulization every 6 (six) hours as needed. For  shortness of breath and wheezing    . IBUPROFEN 400 MG PO TABS Oral Take 1 tablet (400 mg total) by mouth every 6 (six) hours as needed for pain. 30 tablet 0  . MENTHOL (TOPICAL ANALGESIC) 5 % EX PADS Apply externally Apply 1 patch topically as needed. For pain       BP 118/80  Pulse 110  Temp 98.5 F (36.9 C) (Oral)  Resp 18  Ht 5\' 2"  (1.575 m)  Wt 135 lb (61.236 kg)  BMI 24.69 kg/m2  SpO2 100%  LMP 05/31/2012  Physical Exam  Nursing note and vitals reviewed. Constitutional: She is oriented to person, place, and time. She appears well-developed and well-nourished.  HENT:  Head: Normocephalic and atraumatic.  Eyes: Conjunctivae normal and EOM are normal. Pupils are equal, round, and reactive to light.  Neck: Normal range of motion. Neck supple.  Cardiovascular: Normal rate, regular rhythm and normal heart sounds.   Pulmonary/Chest: Effort normal and breath sounds normal.  Abdominal: Soft. Bowel sounds are normal.  Musculoskeletal:       Left elbow full range of motion, strength 4/5 limited secondary to pain. Sensation is intact, good cap refill, no swelling, erythema, or heat from the joint.  Neurological: She is alert and oriented to person, place, and time.  Skin: Skin is warm and dry.  Psychiatric:  She has a normal mood and affect. Her behavior is normal. Judgment and thought content normal.    ED Course  Procedures (including critical care time)  Labs Reviewed - No data to display Dg Elbow Complete Left  06/25/2012  *RADIOLOGY REPORT*  Clinical Data: Lifting injury.  Left elbow pain.  LEFT ELBOW - COMPLETE 3+ VIEW  Comparison: None.  Findings: No fracture.  The elbow joint is normally spaced and aligned.  No joint effusion.  The surrounding soft tissues are unremarkable.  IMPRESSION: Normal left elbow radiographs   Original Report Authenticated By: Domenic Moras, M.D.      1. Elbow sprain       MDM  This is a 26 year old female with left elbow sprain. Have  discussed this patient with Dr. Fredderick Phenix. I am going to discharge the patient with a sling and instructions to followup with orthopedics if her symptoms do not improve. I am also going to give her ibuprofen for pain.  I have also instructed the patient to use ice. Patient is stable and ready for discharge.        Roxy Horseman, PA-C 06/25/12 2015

## 2012-06-25 NOTE — ED Provider Notes (Signed)
Medical screening examination/treatment/procedure(s) were performed by non-physician practitioner and as supervising physician I was immediately available for consultation/collaboration.   Rolan Bucco, MD 06/25/12 [phone]

## 2012-06-25 NOTE — ED Notes (Signed)
Picked up 50lb child yesterday and felt pain in left elbow. Is not able to completely flex or extend elbow without pain and tingling to left fingers and shoulder.

## 2012-08-29 ENCOUNTER — Emergency Department (HOSPITAL_BASED_OUTPATIENT_CLINIC_OR_DEPARTMENT_OTHER)
Admission: EM | Admit: 2012-08-29 | Discharge: 2012-08-29 | Disposition: A | Discharge status: Home / Self Care | Payer: Medicaid Other | Attending: Emergency Medicine | Admitting: Emergency Medicine

## 2012-08-29 ENCOUNTER — Encounter (HOSPITAL_BASED_OUTPATIENT_CLINIC_OR_DEPARTMENT_OTHER): Payer: Self-pay | Admitting: *Deleted

## 2012-08-29 DIAGNOSIS — F17200 Nicotine dependence, unspecified, uncomplicated: Secondary | ICD-10-CM | POA: Insufficient documentation

## 2012-08-29 DIAGNOSIS — Z8742 Personal history of other diseases of the female genital tract: Secondary | ICD-10-CM | POA: Insufficient documentation

## 2012-08-29 DIAGNOSIS — Z79899 Other long term (current) drug therapy: Secondary | ICD-10-CM | POA: Insufficient documentation

## 2012-08-29 DIAGNOSIS — K047 Periapical abscess without sinus: Secondary | ICD-10-CM

## 2012-08-29 MED ORDER — OXYCODONE-ACETAMINOPHEN 5-325 MG PO TABS: 2.0000 | ORAL_TABLET | ORAL | Status: DC | PRN

## 2012-08-29 MED ORDER — OXYCODONE-ACETAMINOPHEN 5-325 MG PO TABS
1.0000 | ORAL_TABLET | Freq: Once | ORAL | Status: AC
  Administered 2012-08-29: 1 via ORAL
  Filled 2012-08-29 (×2): qty 1

## 2012-08-29 MED ORDER — AMOXICILLIN 500 MG PO CAPS: 500.0000 mg | ORAL_CAPSULE | Freq: Two times a day (BID) | ORAL | Status: DC

## 2012-08-29 MED ORDER — AMOXICILLIN 500 MG PO CAPS
500.0000 mg | ORAL_CAPSULE | Freq: Once | ORAL | Status: AC
  Administered 2012-08-29: 500 mg via ORAL
  Filled 2012-08-29: qty 1

## 2012-08-29 NOTE — ED Provider Notes (Signed)
History   This chart was scribed for Richardean Canal, MD scribed by Magnus Sinning. The patient was seen in room MH03/MH03 at 16:46   CSN: [id number]  Arrival date & time 08/29/12  1555   Chief Complaint  Patient presents with  . Facial Swelling    (Consider location/radiation/quality/duration/timing/severity/associated sxs/prior treatment) The history is provided by the patient. No language interpreter was used.   Alicia Brown is a 26 y.o. female who presents to the Emergency Department complaining of gradually worsening moderate right sided facial swelling, onset yesterday with associated facial pain and fever of 100.4 last night. She says she does not have a dentist and that the pain has gradually worsened since yesterday and that she can feel a "pocket" in dental area.  Past Medical History  Diagnosis Date  . Ovarian cyst     Past Surgical History  Procedure Date  . Appendectomy   . Myomectomy   . Tubal ligation     History reviewed. No pertinent family history.  History  Substance Use Topics  . Smoking status: Current Every Day Smoker -- 0.2 packs/day    Types: Cigarettes  . Smokeless tobacco: Not on file  . Alcohol Use: No    OB History    Grav Para Term Preterm Abortions TAB SAB Ect Mult Living   5 4 2 2 1  1   2       Review of Systems  Constitutional: Negative for fever.  HENT: Positive for facial swelling and dental problem.   All other systems reviewed and are negative.    Allergies  Aspirin  Home Medications   Current Outpatient Rx  Name  Route  Sig  Dispense  Refill  . ALBUTEROL SULFATE 1.25 MG/3ML IN NEBU   Nebulization   Take 1 ampule by nebulization every 6 (six) hours as needed. For shortness of breath and wheezing         . IBUPROFEN 400 MG PO TABS   Oral   Take 1 tablet (400 mg total) by mouth every 6 (six) hours as needed for pain.   30 tablet   0   . MENTHOL (TOPICAL ANALGESIC) 5 % EX PADS   Apply externally   Apply 1 patch  topically as needed. For pain          . ONE-A-DAY VITACRAVES PO CHEW   Oral   Chew 1 tablet by mouth daily.             BP 113/58  Pulse 82  Temp 98.7 F (37.1 C) (Oral)  Resp 18  Ht 5\' 2"  (1.575 m)  Wt 130 lb (58.968 kg)  BMI 23.78 kg/m2  SpO2 100%  LMP 08/22/2012  Physical Exam  Nursing note and vitals reviewed. Constitutional: She is oriented to person, place, and time. She appears well-developed and well-nourished. No distress.  HENT:  Head: Normocephalic and atraumatic.  Mouth/Throat:         Parotid gland non-tender and no purulent drainage. Poor dentition overall. Missing R upper molar. R premolar with a large whole in the tooth. Mild swelling around the gums ? Fluctuance.   Eyes: Conjunctivae normal and EOM are normal.  Neck: Neck supple. No tracheal deviation present.  Cardiovascular: Normal rate, regular rhythm and normal heart sounds.   Pulmonary/Chest: Effort normal. No respiratory distress. She has no wheezes. She has no rales.       Lungs clear to auscultation   Abdominal: She exhibits no distension.  Musculoskeletal: Normal  range of motion.  Lymphadenopathy:    She has no cervical adenopathy.  Neurological: She is alert and oriented to person, place, and time. No sensory deficit.  Skin: Skin is warm and dry.  Psychiatric: She has a normal mood and affect. Her behavior is normal.    ED Course  Procedures (including critical care time) 16:47: Physical exam performed Labs Reviewed - No data to display No results found.   No diagnosis found.    MDM  Alicia Brown is a 26 y.o. female here with R upper toothache. She likely has periapical abscess from tooth infection. Will finish a course of amoxicillin and will give percocet prn for pain. She will f/u with her dentist.   I personally performed the services described in this documentation, which was scribed in my presence. The recorded information has been reviewed and is  accurate.        Richardean Canal, MD 08/29/12 [phone]

## 2012-08-29 NOTE — ED Notes (Signed)
Facial swelling and dental pain since yesterday
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
## 2014-03-11 ENCOUNTER — Emergency Department (HOSPITAL_BASED_OUTPATIENT_CLINIC_OR_DEPARTMENT_OTHER)
Admission: EM | Admit: 2014-03-11 | Discharge: 2014-03-12 | Disposition: A | Discharge status: Home / Self Care | Payer: Medicaid Other | Attending: Emergency Medicine | Admitting: Emergency Medicine

## 2014-03-11 ENCOUNTER — Emergency Department (HOSPITAL_BASED_OUTPATIENT_CLINIC_OR_DEPARTMENT_OTHER): Payer: Medicaid Other

## 2014-03-11 ENCOUNTER — Encounter (HOSPITAL_BASED_OUTPATIENT_CLINIC_OR_DEPARTMENT_OTHER): Payer: Self-pay | Admitting: Emergency Medicine

## 2014-03-11 DIAGNOSIS — Z79899 Other long term (current) drug therapy: Secondary | ICD-10-CM | POA: Diagnosis not present

## 2014-03-11 DIAGNOSIS — Z3202 Encounter for pregnancy test, result negative: Secondary | ICD-10-CM | POA: Insufficient documentation

## 2014-03-11 DIAGNOSIS — F17200 Nicotine dependence, unspecified, uncomplicated: Secondary | ICD-10-CM | POA: Diagnosis not present

## 2014-03-11 DIAGNOSIS — R112 Nausea with vomiting, unspecified: Secondary | ICD-10-CM | POA: Diagnosis present

## 2014-03-11 DIAGNOSIS — R109 Unspecified abdominal pain: Secondary | ICD-10-CM | POA: Insufficient documentation

## 2014-03-11 DIAGNOSIS — Z8742 Personal history of other diseases of the female genital tract: Secondary | ICD-10-CM | POA: Insufficient documentation

## 2014-03-11 DIAGNOSIS — N390 Urinary tract infection, site not specified: Secondary | ICD-10-CM

## 2014-03-11 DIAGNOSIS — Z792 Long term (current) use of antibiotics: Secondary | ICD-10-CM | POA: Insufficient documentation

## 2014-03-11 LAB — URINE MICROSCOPIC-ADD ON

## 2014-03-11 LAB — CBC WITH DIFFERENTIAL/PLATELET
Basophils Absolute: 0 10*3/uL (ref 0.0–0.1)
Basophils Relative: 0 % (ref 0–1)
EOS ABS: 0.1 10*3/uL (ref 0.0–0.7)
Eosinophils Relative: 1 % (ref 0–5)
HEMATOCRIT: 42.3 % (ref 36.0–46.0)
Hemoglobin: 14.3 g/dL (ref 12.0–15.0)
LYMPHS ABS: 0.6 10*3/uL — AB (ref 0.7–4.0)
LYMPHS PCT: 6 % — AB (ref 12–46)
MCH: 31.5 pg (ref 26.0–34.0)
MCHC: 33.8 g/dL (ref 30.0–36.0)
MCV: 93.2 fL (ref 78.0–100.0)
MONO ABS: 0.4 10*3/uL (ref 0.1–1.0)
Monocytes Relative: 4 % (ref 3–12)
Neutro Abs: 8 10*3/uL — ABNORMAL HIGH (ref 1.7–7.7)
Neutrophils Relative %: 88 % — ABNORMAL HIGH (ref 43–77)
PLATELETS: 371 10*3/uL (ref 150–400)
RBC: 4.54 MIL/uL (ref 3.87–5.11)
RDW: 12.1 % (ref 11.5–15.5)
WBC: 9.1 10*3/uL (ref 4.0–10.5)

## 2014-03-11 LAB — COMPREHENSIVE METABOLIC PANEL
ALT: 14 U/L (ref 0–35)
ANION GAP: 16 — AB (ref 5–15)
AST: 20 U/L (ref 0–37)
Albumin: 4.8 g/dL (ref 3.5–5.2)
Alkaline Phosphatase: 94 U/L (ref 39–117)
BUN: 7 mg/dL (ref 6–23)
CALCIUM: 9.6 mg/dL (ref 8.4–10.5)
CO2: 22 meq/L (ref 19–32)
CREATININE: 0.7 mg/dL (ref 0.50–1.10)
Chloride: 105 mEq/L (ref 96–112)
GLUCOSE: 92 mg/dL (ref 70–99)
Potassium: 3.7 mEq/L (ref 3.7–5.3)
Sodium: 143 mEq/L (ref 137–147)
TOTAL PROTEIN: 8.1 g/dL (ref 6.0–8.3)
Total Bilirubin: 0.7 mg/dL (ref 0.3–1.2)

## 2014-03-11 LAB — URINALYSIS, ROUTINE W REFLEX MICROSCOPIC
Bilirubin Urine: NEGATIVE
GLUCOSE, UA: NEGATIVE mg/dL
KETONES UR: NEGATIVE mg/dL
Nitrite: POSITIVE — AB
PH: 5.5 (ref 5.0–8.0)
Protein, ur: NEGATIVE mg/dL
Specific Gravity, Urine: 1.027 (ref 1.005–1.030)
Urobilinogen, UA: 1 mg/dL (ref 0.0–1.0)

## 2014-03-11 LAB — PREGNANCY, URINE: PREG TEST UR: NEGATIVE

## 2014-03-11 LAB — D-DIMER, QUANTITATIVE (NOT AT ARMC): D DIMER QUANT: 0.54 ug{FEU}/mL — AB (ref 0.00–0.48)

## 2014-03-11 MED ORDER — SODIUM CHLORIDE 0.9 % IV BOLUS (SEPSIS)
1000.0000 mL | Freq: Once | INTRAVENOUS | Status: AC
  Administered 2014-03-11: 1000 mL via INTRAVENOUS

## 2014-03-11 MED ORDER — ONDANSETRON HCL 4 MG/2ML IJ SOLN
INTRAMUSCULAR | Status: AC
  Filled 2014-03-11: qty 2

## 2014-03-11 MED ORDER — ONDANSETRON HCL 4 MG/2ML IJ SOLN
4.0000 mg | Freq: Once | INTRAMUSCULAR | Status: AC
  Administered 2014-03-11: 4 mg via INTRAVENOUS

## 2014-03-11 MED ORDER — ONDANSETRON 4 MG PO TBDP: 4.0000 mg | ORAL_TABLET | Freq: Three times a day (TID) | ORAL | Status: DC | PRN

## 2014-03-11 MED ORDER — PROMETHAZINE HCL 25 MG PO TABS: 25.0000 mg | ORAL_TABLET | Freq: Four times a day (QID) | ORAL | Status: DC | PRN

## 2014-03-11 MED ORDER — DEXTROSE 5 % IV SOLN
1.0000 g | Freq: Once | INTRAVENOUS | Status: AC
  Administered 2014-03-11: 1 g via INTRAVENOUS

## 2014-03-11 MED ORDER — SODIUM CHLORIDE 0.9 % IV SOLN: INTRAVENOUS | Status: DC

## 2014-03-11 MED ORDER — HYDROMORPHONE HCL PF 1 MG/ML IJ SOLN
0.5000 mg | Freq: Once | INTRAMUSCULAR | Status: AC
  Administered 2014-03-11: 0.5 mg via INTRAVENOUS
  Filled 2014-03-11: qty 1

## 2014-03-11 MED ORDER — CEPHALEXIN 500 MG PO CAPS: 500.0000 mg | ORAL_CAPSULE | Freq: Four times a day (QID) | ORAL | Status: DC

## 2014-03-11 MED ORDER — ONDANSETRON HCL 4 MG/2ML IJ SOLN
4.0000 mg | Freq: Once | INTRAMUSCULAR | Status: AC
  Administered 2014-03-11: 4 mg via INTRAVENOUS
  Filled 2014-03-11: qty 2

## 2014-03-11 MED ORDER — HYDROCODONE-ACETAMINOPHEN 5-325 MG PO TABS: 1.0000 | ORAL_TABLET | Freq: Four times a day (QID) | ORAL | Status: DC | PRN

## 2014-03-11 MED ORDER — CEFTRIAXONE SODIUM 1 G IJ SOLR
INTRAMUSCULAR | Status: AC
  Filled 2014-03-11: qty 10

## 2014-03-11 NOTE — ED Provider Notes (Signed)
CSN: [id number]     Arrival date & time 03/11/14  1715 History  This chart was scribed for Vanetta MuldersScott Saanya Zieske, MD by Evon Slackerrance Branch, ED Scribe. This patient was seen in room MH02/MH02 and the patient's care was started at 8:35 PM.     Chief Complaint  Patient presents with  . Emesis   Patient is a 28 y.o. female presenting with vomiting. The history is provided by the patient. No language interpreter was used.  Emesis Associated symptoms: no abdominal pain, no chills, no diarrhea, no headaches and no sore throat    HPI Comments: Fredia Beetsngela Y Fluegge is a 28 y.o. female who presents to the Emergency Department complaining of emesis x7 onset 1PM. She states she has associated nausea, left sided abdominal pain, vaginal bleeding due to menstrual cycle, and back pain. She states the pain severity is 5/10 currently. She states when vomiting the pain worsens to 8/10. She states she has a history urinary retention syndrome. She denies dysuria, diarrhea, fever, chills, visual disturbance, cough, rhinorrhea, sore throat, chest pain, SOB, ankle edema, rash, or history of bleeding easily.  Past Medical History  Diagnosis Date  . Ovarian cyst    Past Surgical History  Procedure Laterality Date  . Appendectomy    . Myomectomy    . Tubal ligation     No family history on file. History  Substance Use Topics  . Smoking status: Current Every Day Smoker -- 0.25 packs/day    Types: Cigarettes  . Smokeless tobacco: Not on file  . Alcohol Use: No   OB History   Grav Para Term Preterm Abortions TAB SAB Ect Mult Living   5 4 2 2 1  1   2      Review of Systems  Constitutional: Negative for fever and chills.  HENT: Negative for rhinorrhea and sore throat.   Eyes: Negative for visual disturbance.  Respiratory: Negative for cough and shortness of breath.   Cardiovascular: Negative for chest pain.  Gastrointestinal: Positive for nausea and vomiting. Negative for abdominal pain and diarrhea.  Genitourinary:  Positive for vaginal bleeding. Negative for dysuria.  Musculoskeletal: Positive for back pain. Negative for neck pain.  Skin: Negative for rash.  Neurological: Negative for headaches.  Psychiatric/Behavioral: Negative for confusion.    Allergies  Aspirin  Home Medications   Prior to Admission medications   Medication Sig Start Date End Date Taking? Authorizing Provider  albuterol (ACCUNEB) 1.25 MG/3ML nebulizer solution Take 1 ampule by nebulization every 6 (six) hours as needed. For shortness of breath and wheezing    Historical Provider, MD  amoxicillin (AMOXIL) 500 MG capsule Take 1 capsule (500 mg total) by mouth 2 (two) times daily. 08/29/12   Richardean Canalavid H Yao, MD  cephALEXin (KEFLEX) 500 MG capsule Take 1 capsule (500 mg total) by mouth 4 (four) times daily. 03/11/14   Vanetta MuldersScott Johnwesley Lederman, MD  HYDROcodone-acetaminophen (NORCO/VICODIN) 5-325 MG per tablet Take 1-2 tablets by mouth every 6 (six) hours as needed for moderate pain. 03/11/14   Vanetta MuldersScott Shaka Cardin, MD  ibuprofen (ADVIL,MOTRIN) 400 MG tablet Take 1 tablet (400 mg total) by mouth every 6 (six) hours as needed for pain. 06/25/12   Roxy Horsemanobert Browning, PA-C  Menthol, Topical Analgesic, 5 % PADS Apply 1 patch topically as needed. For pain     Historical Provider, MD  Multiple Vitamins-Minerals (ONE-A-DAY VITACRAVES) CHEW Chew 1 tablet by mouth daily.      Historical Provider, MD  ondansetron (ZOFRAN ODT) 4 MG disintegrating tablet Take 1  tablet (4 mg total) by mouth every 8 (eight) hours as needed for nausea or vomiting. 03/11/14   Vanetta Mulders, MD  oxyCODONE-acetaminophen (PERCOCET) 5-325 MG per tablet Take 2 tablets by mouth every 4 (four) hours as needed for pain. 08/29/12   Richardean Canal, MD  promethazine (PHENERGAN) 25 MG tablet Take 1 tablet (25 mg total) by mouth every 6 (six) hours as needed for nausea or vomiting. 03/11/14   Vanetta Mulders, MD   Triage Vitals: BP 113/70  Pulse 83  Temp(Src) 98 F (36.7 C) (Oral)  Resp 20  Ht 5\' 2"  (1.575  m)  Wt 130 lb (58.968 kg)  BMI 23.77 kg/m2  SpO2 100%  LMP 03/09/2014  Physical Exam  Nursing note and vitals reviewed. Constitutional: She is oriented to person, place, and time. She appears well-developed and well-nourished. No distress.  HENT:  Head: Normocephalic and atraumatic.  Eyes: Conjunctivae and EOM are normal.  Neck: Neck supple. No tracheal deviation present.  Cardiovascular: Normal rate, regular rhythm and normal heart sounds.   Pulmonary/Chest: Effort normal and breath sounds normal. No respiratory distress.  Abdominal: Bowel sounds are normal. There is no tenderness. There is CVA tenderness.  Left sided CVA tenderness   Musculoskeletal: Normal range of motion. She exhibits no edema.  Neurological: She is alert and oriented to person, place, and time. No cranial nerve deficit. She exhibits normal muscle tone. Coordination normal.  Skin: Skin is warm and dry.  Psychiatric: She has a normal mood and affect. Her behavior is normal.    ED Course  Procedures (including critical care time) DIAGNOSTIC STUDIES: Oxygen Saturation is 100% on RA, normal by my interpretation.    COORDINATION OF CARE: 8:44 PM-Discussed treatment plan which includes CBC panel, CMP, and UA with pt at bedside and pt agreed to plan.     Labs Review Labs Reviewed  URINALYSIS, ROUTINE W REFLEX MICROSCOPIC - Abnormal; Notable for the following:    Color, Urine AMBER (*)    APPearance CLOUDY (*)    Hgb urine dipstick LARGE (*)    Nitrite POSITIVE (*)    Leukocytes, UA SMALL (*)    All other components within normal limits  URINE MICROSCOPIC-ADD ON - Abnormal; Notable for the following:    Squamous Epithelial / LPF FEW (*)    Bacteria, UA MANY (*)    All other components within normal limits  CBC WITH DIFFERENTIAL - Abnormal; Notable for the following:    Neutrophils Relative % 88 (*)    Neutro Abs 8.0 (*)    Lymphocytes Relative 6 (*)    Lymphs Abs 0.6 (*)    All other components within  normal limits  COMPREHENSIVE METABOLIC PANEL - Abnormal; Notable for the following:    Anion gap 16 (*)    All other components within normal limits  D-DIMER, QUANTITATIVE - Abnormal; Notable for the following:    D-Dimer, Quant 0.54 (*)    All other components within normal limits  URINE CULTURE  PREGNANCY, URINE   Results for orders placed during the hospital encounter of 03/11/14  URINALYSIS, ROUTINE W REFLEX MICROSCOPIC      Result Value Ref Range   Color, Urine AMBER (*) YELLOW   APPearance CLOUDY (*) CLEAR   Specific Gravity, Urine 1.027  1.005 - 1.030   pH 5.5  5.0 - 8.0   Glucose, UA NEGATIVE  NEGATIVE mg/dL   Hgb urine dipstick LARGE (*) NEGATIVE   Bilirubin Urine NEGATIVE  NEGATIVE   Ketones,  ur NEGATIVE  NEGATIVE mg/dL   Protein, ur NEGATIVE  NEGATIVE mg/dL   Urobilinogen, UA 1.0  0.0 - 1.0 mg/dL   Nitrite POSITIVE (*) NEGATIVE   Leukocytes, UA SMALL (*) NEGATIVE  PREGNANCY, URINE      Result Value Ref Range   Preg Test, Ur NEGATIVE  NEGATIVE  URINE MICROSCOPIC-ADD ON      Result Value Ref Range   Squamous Epithelial / LPF FEW (*) RARE   WBC, UA 7-10  <3 WBC/hpf   RBC / HPF 3-6  <3 RBC/hpf   Bacteria, UA MANY (*) RARE   Urine-Other MUCOUS PRESENT    CBC WITH DIFFERENTIAL      Result Value Ref Range   WBC 9.1  4.0 - 10.5 K/uL   RBC 4.54  3.87 - 5.11 MIL/uL   Hemoglobin 14.3  12.0 - 15.0 g/dL   HCT 16.1  09.6 - 04.5 %   MCV 93.2  78.0 - 100.0 fL   MCH 31.5  26.0 - 34.0 pg   MCHC 33.8  30.0 - 36.0 g/dL   RDW 40.9  81.1 - 91.4 %   Platelets 371  150 - 400 K/uL   Neutrophils Relative % 88 (*) 43 - 77 %   Neutro Abs 8.0 (*) 1.7 - 7.7 K/uL   Lymphocytes Relative 6 (*) 12 - 46 %   Lymphs Abs 0.6 (*) 0.7 - 4.0 K/uL   Monocytes Relative 4  3 - 12 %   Monocytes Absolute 0.4  0.1 - 1.0 K/uL   Eosinophils Relative 1  0 - 5 %   Eosinophils Absolute 0.1  0.0 - 0.7 K/uL   Basophils Relative 0  0 - 1 %   Basophils Absolute 0.0  0.0 - 0.1 K/uL  COMPREHENSIVE METABOLIC  PANEL      Result Value Ref Range   Sodium 143  137 - 147 mEq/L   Potassium 3.7  3.7 - 5.3 mEq/L   Chloride 105  96 - 112 mEq/L   CO2 22  19 - 32 mEq/L   Glucose, Bld 92  70 - 99 mg/dL   BUN 7  6 - 23 mg/dL   Creatinine, Ser 7.82  0.50 - 1.10 mg/dL   Calcium 9.6  8.4 - 95.6 mg/dL   Total Protein 8.1  6.0 - 8.3 g/dL   Albumin 4.8  3.5 - 5.2 g/dL   AST 20  0 - 37 U/L   ALT 14  0 - 35 U/L   Alkaline Phosphatase 94  39 - 117 U/L   Total Bilirubin 0.7  0.3 - 1.2 mg/dL   GFR calc non Af Amer >90  >90 mL/min   GFR calc Af Amer >90  >90 mL/min   Anion gap 16 (*) 5 - 15  D-DIMER, QUANTITATIVE      Result Value Ref Range   D-Dimer, Quant 0.54 (*) 0.00 - 0.48 ug/mL-FEU     Imaging Review Ct Abdomen Pelvis Wo Contrast  03/11/2014   CLINICAL DATA:  Left flank pain.  Nausea, vomiting.  EXAM: CT ABDOMEN AND PELVIS WITHOUT CONTRAST  TECHNIQUE: Multidetector CT imaging of the abdomen and pelvis was performed following the standard protocol without IV contrast.  COMPARISON:  01/18/2013  FINDINGS: Lung bases are clear.  No effusions.  Heart is normal size.  Liver, gallbladder, spleen, pancreas, adrenals and kidneys are normal. No renal or ureteral stones. No hydronephrosis.  Bilateral tubal ligation clips are noted. Uterus, ovaries and urinary bladder grossly  unremarkable unenhanced appearance. Appendix not definitively seen, but no inflammatory process in the right lower quadrant. Stomach, large and small bowel grossly unremarkable. No acute bony abnormality.  IMPRESSION: Unremarkable noncontrast CT. No evidence of renal or ureteral stones. No hydronephrosis.   Electronically Signed   By: Charlett NoseKevin  Dover M.D.   On: 03/11/2014 21:32     EKG Interpretation None      MDM   Final diagnoses:  Nausea and vomiting in adult  UTI (lower urinary tract infection)   The patient with abnormal urine consistent urinary tract infection. Patient's symptoms seem to be more consistent with pyelonephritis. However no  significant leukocytosis no fever CT scan was done to rule out obstructing stone infection. That was negative. Patient had a lot of left flank pain. Patient given Rocephin here we continued on Keflex patient will be given anti-nausea medicine. Patient will return if not improved in 2 days. Or for any newer worse symptoms in the meantime. Aspirin EC test was negative.  Patient had a d-dimer ordered erroneously. Patient's currently on her menses I think that's why it's mildly elevated patient has no leg swelling no shortness of breath no chest pain. Not going to clinically [place] that elevated d-dimer showing moderately elevated. Patient has no risk factors currently for pulmonary embolus or DVT.      I personally performed the services described in this documentation, which was scribed in my presence. The recorded information has been reviewed and is accurate.       Vanetta MuldersScott Gianah Batt, MD 03/11/14 [phone]

## 2014-03-11 NOTE — ED Notes (Signed)
Vomiting since this afternoon. No pain.

## 2014-03-11 NOTE — Discharge Instructions (Signed)
Take antibiotic as directed until completed. We turn for any new or worse symptoms. Typically for fever or persistent vomiting. On the antibiotic if your symptoms should be improved in 2 days if they're not not working properly needed return. Take pain medication as needed.

## 2014-03-14 LAB — URINE CULTURE

## 2014-03-16 ENCOUNTER — Emergency Department (HOSPITAL_BASED_OUTPATIENT_CLINIC_OR_DEPARTMENT_OTHER)
Admission: EM | Admit: 2014-03-16 | Discharge: 2014-03-16 | Disposition: A | Discharge status: Home / Self Care | Payer: Medicaid Other | Attending: Emergency Medicine | Admitting: Emergency Medicine

## 2014-03-16 ENCOUNTER — Encounter (HOSPITAL_BASED_OUTPATIENT_CLINIC_OR_DEPARTMENT_OTHER): Payer: Self-pay | Admitting: Emergency Medicine

## 2014-03-16 DIAGNOSIS — Z8742 Personal history of other diseases of the female genital tract: Secondary | ICD-10-CM | POA: Insufficient documentation

## 2014-03-16 DIAGNOSIS — Z792 Long term (current) use of antibiotics: Secondary | ICD-10-CM | POA: Diagnosis not present

## 2014-03-16 DIAGNOSIS — F17200 Nicotine dependence, unspecified, uncomplicated: Secondary | ICD-10-CM | POA: Insufficient documentation

## 2014-03-16 DIAGNOSIS — Z3202 Encounter for pregnancy test, result negative: Secondary | ICD-10-CM | POA: Diagnosis not present

## 2014-03-16 DIAGNOSIS — R110 Nausea: Secondary | ICD-10-CM | POA: Insufficient documentation

## 2014-03-16 LAB — CBC WITH DIFFERENTIAL/PLATELET
BASOS ABS: 0 10*3/uL (ref 0.0–0.1)
BASOS PCT: 0 % (ref 0–1)
Eosinophils Absolute: 0.1 10*3/uL (ref 0.0–0.7)
Eosinophils Relative: 2 % (ref 0–5)
HCT: 35.1 % — ABNORMAL LOW (ref 36.0–46.0)
HEMOGLOBIN: 11.6 g/dL — AB (ref 12.0–15.0)
LYMPHS PCT: 21 % (ref 12–46)
Lymphs Abs: 1.4 10*3/uL (ref 0.7–4.0)
MCH: 31.4 pg (ref 26.0–34.0)
MCHC: 33 g/dL (ref 30.0–36.0)
MCV: 94.9 fL (ref 78.0–100.0)
MONOS PCT: 7 % (ref 3–12)
Monocytes Absolute: 0.5 10*3/uL (ref 0.1–1.0)
NEUTROS ABS: 4.6 10*3/uL (ref 1.7–7.7)
NEUTROS PCT: 69 % (ref 43–77)
Platelets: 356 10*3/uL (ref 150–400)
RBC: 3.7 MIL/uL — ABNORMAL LOW (ref 3.87–5.11)
RDW: 11.8 % (ref 11.5–15.5)
WBC: 6.6 10*3/uL (ref 4.0–10.5)

## 2014-03-16 LAB — BASIC METABOLIC PANEL
ANION GAP: 9 (ref 5–15)
BUN: 4 mg/dL — AB (ref 6–23)
CHLORIDE: 110 meq/L (ref 96–112)
CO2: 27 mEq/L (ref 19–32)
Calcium: 9.4 mg/dL (ref 8.4–10.5)
Creatinine, Ser: 0.7 mg/dL (ref 0.50–1.10)
GFR calc non Af Amer: >90 mL/min (ref >90)
Glucose, Bld: 102 mg/dL — ABNORMAL HIGH (ref 70–99)
POTASSIUM: 3.8 meq/L (ref 3.7–5.3)
Sodium: 146 mEq/L (ref 137–147)

## 2014-03-16 LAB — URINALYSIS, ROUTINE W REFLEX MICROSCOPIC
Bilirubin Urine: NEGATIVE
GLUCOSE, UA: NEGATIVE mg/dL
Hgb urine dipstick: NEGATIVE
Ketones, ur: NEGATIVE mg/dL
Nitrite: NEGATIVE
PH: 7.5 (ref 5.0–8.0)
PROTEIN: NEGATIVE mg/dL
SPECIFIC GRAVITY, URINE: 1.006 (ref 1.005–1.030)
Urobilinogen, UA: 0.2 mg/dL (ref 0.0–1.0)

## 2014-03-16 LAB — URINE MICROSCOPIC-ADD ON

## 2014-03-16 LAB — PREGNANCY, URINE: PREG TEST UR: NEGATIVE

## 2014-03-16 MED ORDER — ONDANSETRON 4 MG PO TBDP
4.0000 mg | ORAL_TABLET | Freq: Once | ORAL | Status: AC
  Administered 2014-03-16: 4 mg via ORAL
  Filled 2014-03-16: qty 1

## 2014-03-16 MED ORDER — ONDANSETRON 4 MG PO TBDP: 4.0000 mg | ORAL_TABLET | Freq: Three times a day (TID) | ORAL | Status: DC | PRN

## 2014-03-16 NOTE — ED Provider Notes (Signed)
CSN: [id number]     Arrival date & time 03/16/14  1524 History   First MD Initiated Contact with Patient 03/16/14 1542     Chief Complaint  Patient presents with  . Nausea     (Consider location/radiation/quality/duration/timing/severity/associated sxs/prior Treatment) HPI Comments: Pt is here with c/o nausea without vomiting and left flank pain for the last 5 days. Pt states that she was seen five days ago and had dehydration and a uti. Pt continues to take antibiotic for uti. No fever. No abdominal pain. Pt states that she just hasn't really felt good since she was here.  The history is provided by the patient. No language interpreter was used.    Past Medical History  Diagnosis Date  . Ovarian cyst    Past Surgical History  Procedure Laterality Date  . Appendectomy    . Myomectomy    . Tubal ligation     No family history on file. History  Substance Use Topics  . Smoking status: Current Every Day Smoker -- 0.25 packs/day    Types: Cigarettes  . Smokeless tobacco: Not on file  . Alcohol Use: No   OB History   Grav Para Term Preterm Abortions TAB SAB Ect Mult Living   5 4 2 2 1  1   2      Review of Systems  Constitutional: Negative.   Respiratory: Negative.   Cardiovascular: Negative.       Allergies  Aspirin and Other  Home Medications   Prior to Admission medications   Medication Sig Start Date End Date Taking? Authorizing Provider  albuterol (ACCUNEB) 1.25 MG/3ML nebulizer solution Take 1 ampule by nebulization every 6 (six) hours as needed. For shortness of breath and wheezing    Historical Provider, MD  amoxicillin (AMOXIL) 500 MG capsule Take 1 capsule (500 mg total) by mouth 2 (two) times daily. 08/29/12   Richardean Canalavid H Yao, MD  cephALEXin (KEFLEX) 500 MG capsule Take 1 capsule (500 mg total) by mouth 4 (four) times daily. 03/11/14   Vanetta MuldersScott Zackowski, MD  HYDROcodone-acetaminophen (NORCO/VICODIN) 5-325 MG per tablet Take 1-2 tablets by mouth every 6 (six) hours  as needed for moderate pain. 03/11/14   Vanetta MuldersScott Zackowski, MD  ibuprofen (ADVIL,MOTRIN) 400 MG tablet Take 1 tablet (400 mg total) by mouth every 6 (six) hours as needed for pain. 06/25/12   Roxy Horsemanobert Browning, PA-C  Menthol, Topical Analgesic, 5 % PADS Apply 1 patch topically as needed. For pain     Historical Provider, MD  Multiple Vitamins-Minerals (ONE-A-DAY VITACRAVES) CHEW Chew 1 tablet by mouth daily.      Historical Provider, MD  ondansetron (ZOFRAN ODT) 4 MG disintegrating tablet Take 1 tablet (4 mg total) by mouth every 8 (eight) hours as needed for nausea or vomiting. 03/11/14   Vanetta MuldersScott Zackowski, MD  oxyCODONE-acetaminophen (PERCOCET) 5-325 MG per tablet Take 2 tablets by mouth every 4 (four) hours as needed for pain. 08/29/12   Richardean Canalavid H Yao, MD  promethazine (PHENERGAN) 25 MG tablet Take 1 tablet (25 mg total) by mouth every 6 (six) hours as needed for nausea or vomiting. 03/11/14   Vanetta MuldersScott Zackowski, MD   BP 135/81  Pulse 94  Temp(Src) 98.7 F (37.1 C) (Oral)  Resp 18  Ht 5' 2.5" (1.588 m)  Wt 130 lb (58.968 kg)  BMI 23.38 kg/m2  SpO2 99%  LMP 03/09/2014 Physical Exam  Nursing note and vitals reviewed. Constitutional: She is oriented to person, place, and time. She appears well-developed and well-nourished.  HENT:  Head: Normocephalic and atraumatic.  Cardiovascular: Normal rate and regular rhythm.   Pulmonary/Chest: Effort normal.  Abdominal: Soft. Bowel sounds are normal. There is no tenderness. There is no CVA tenderness.  Musculoskeletal: Normal range of motion.  Neurological: She is alert and oriented to person, place, and time. She exhibits normal muscle tone. Coordination normal.  Skin: Skin is warm and dry.  Psychiatric: She has a normal mood and affect.    ED Course  Procedures (including critical care time) Labs Review Labs Reviewed  URINALYSIS, ROUTINE W REFLEX MICROSCOPIC - Abnormal; Notable for the following:    Leukocytes, UA TRACE (*)    All other components within  normal limits  CBC WITH DIFFERENTIAL - Abnormal; Notable for the following:    RBC 3.70 (*)    Hemoglobin 11.6 (*)    HCT 35.1 (*)    All other components within normal limits  BASIC METABOLIC PANEL - Abnormal; Notable for the following:    Glucose, Bld 102 (*)    BUN 4 (*)    All other components within normal limits  URINE MICROSCOPIC-ADD ON - Abnormal; Notable for the following:    Bacteria, UA MANY (*)    All other components within normal limits  URINE CULTURE  PREGNANCY, URINE    Imaging Review No results found.   EKG Interpretation None      MDM   Final diagnoses:  Nausea    Pt abdomen is benign. Clinically pt doesn't have pyelo. Don't think further imaging is needed again. Pt tolerating po here without any problem. Urine is getting better and culture was sensitive to keflex which she is on. Will send home with zofran    Teressa LowerVrinda Denetria Luevanos, NP 03/16/14 1814

## 2014-03-16 NOTE — ED Provider Notes (Signed)
Medical screening examination/treatment/procedure(s) were performed by non-physician practitioner and as supervising physician I was immediately available for consultation/collaboration.   EKG Interpretation None        Alicia ChurnJohn David Gordon Carlson III, MD 03/16/14 [phone]

## 2014-03-16 NOTE — Discharge Instructions (Signed)

## 2014-03-16 NOTE — ED Notes (Signed)
C/o nausea, left flank pain-seen 5 days ago for n/v/dehydration per pt

## 2014-03-16 NOTE — ED Notes (Signed)
NP at bedside.

## 2014-03-18 ENCOUNTER — Telehealth (HOSPITAL_BASED_OUTPATIENT_CLINIC_OR_DEPARTMENT_OTHER): Payer: Self-pay | Admitting: Emergency Medicine

## 2014-03-18 LAB — URINE CULTURE
Colony Count: NO GROWTH
Culture: NO GROWTH

## 2014-03-18 NOTE — Telephone Encounter (Signed)
Post ED Visit - Positive Culture Follow-up  Culture report reviewed by antimicrobial stewardship pharmacist: []  Wes Dulaney, Pharm.D., BCPS [x]  Celedonio MiyamotoJeremy Frens, Pharm.D., BCPS []  Georgina PillionElizabeth Martin, Pharm.D., BCPS []  Glade SpringMinh Pham, [address] BoulevardPharm.D., BCPS, AAHIVP []  Estella HuskMichelle Turner, Pharm.D., BCPS, AAHIVP  Positive urine culture Treated with Keflex, organism sensitive to the same and no further patient follow-up is required at this time.  Zeb ComfortHolland, Abdou Stocks 03/18/2014, 2:33 PM

## 2014-05-25 ENCOUNTER — Emergency Department (HOSPITAL_BASED_OUTPATIENT_CLINIC_OR_DEPARTMENT_OTHER)
Admission: EM | Admit: 2014-05-25 | Discharge: 2014-05-25 | Disposition: A | Discharge status: Home / Self Care | Payer: Medicaid Other | Attending: Emergency Medicine | Admitting: Emergency Medicine

## 2014-05-25 ENCOUNTER — Encounter (HOSPITAL_BASED_OUTPATIENT_CLINIC_OR_DEPARTMENT_OTHER): Payer: Self-pay | Admitting: Emergency Medicine

## 2014-05-25 DIAGNOSIS — F17200 Nicotine dependence, unspecified, uncomplicated: Secondary | ICD-10-CM | POA: Insufficient documentation

## 2014-05-25 DIAGNOSIS — H109 Unspecified conjunctivitis: Secondary | ICD-10-CM | POA: Insufficient documentation

## 2014-05-25 DIAGNOSIS — Z792 Long term (current) use of antibiotics: Secondary | ICD-10-CM | POA: Insufficient documentation

## 2014-05-25 DIAGNOSIS — Z8742 Personal history of other diseases of the female genital tract: Secondary | ICD-10-CM | POA: Diagnosis not present

## 2014-05-25 DIAGNOSIS — Z791 Long term (current) use of non-steroidal anti-inflammatories (NSAID): Secondary | ICD-10-CM | POA: Diagnosis not present

## 2014-05-25 DIAGNOSIS — H5789 Other specified disorders of eye and adnexa: Secondary | ICD-10-CM | POA: Diagnosis present

## 2014-05-25 MED ORDER — GENTAMICIN SULFATE 0.3 % OP SOLN: 2.0000 [drp] | Freq: Four times a day (QID) | OPHTHALMIC | Status: DC

## 2014-05-25 NOTE — Discharge Instructions (Signed)
Gentamicin eyedrops as prescribed.  Followup with your doctor if not improving in the next 2 days, and return to the ER if your symptoms substantially worsen or change.   Conjunctivitis Conjunctivitis is commonly called "pink eye." Conjunctivitis can be caused by bacterial or viral infection, allergies, or injuries. There is usually redness of the lining of the eye, itching, discomfort, and sometimes discharge. There may be deposits of matter along the eyelids. A viral infection usually causes a watery discharge, while a bacterial infection causes a yellowish, thick discharge. Pink eye is very contagious and spreads by direct contact. You may be given antibiotic eyedrops as part of your treatment. Before using your eye medicine, remove all drainage from the eye by washing gently with warm water and cotton balls. Continue to use the medication until you have awakened 2 mornings in a row without discharge from the eye. Do not rub your eye. This increases the irritation and helps spread infection. Use separate towels from other household members. Wash your hands with soap and water before and after touching your eyes. Use cold compresses to reduce pain and sunglasses to relieve irritation from light. Do not wear contact lenses or wear eye makeup until the infection is gone. SEEK MEDICAL CARE IF:   Your symptoms are not better after 3 days of treatment.  You have increased pain or trouble seeing.  The outer eyelids become very red or swollen. Document Released: 10/03/2004 Document Revised: 11/18/2011 Document Reviewed: 08/26/2005 [facility] Patient Information 2015 [place], [place]. This information is not intended to replace advice given to you by your health care provider. Make sure you discuss any questions you have with your health care provider.

## 2014-05-25 NOTE — ED Notes (Signed)
Reports pain and burning to bilateral eyes x 3 days

## 2014-05-25 NOTE — ED Provider Notes (Signed)
CSN: [id number]     Arrival date & time 05/25/14  0806 History   First MD Initiated Contact with Patient 05/25/14 [phone]     Chief Complaint  Patient presents with  . Eye Drainage     (Consider location/radiation/quality/duration/timing/severity/associated sxs/prior Treatment) HPI Comments: Patient is a 28 year old female who presents with complaints of bilateral eye redness, irritation, and drainage for the past several days. 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 She does not wear contacts and denies any recent exposures. Her eyes were matted shut when she woke up this morning.  Patient is a 28 y.o. female presenting with conjunctivitis. The history is provided by the patient.  Conjunctivitis This is a new problem. The current episode started 2 days ago. The problem occurs constantly. The problem has been gradually worsening. Nothing aggravates the symptoms. Nothing relieves the symptoms. She has tried nothing for the symptoms. The treatment provided no relief.    Past Medical History  Diagnosis Date  . Ovarian cyst    Past Surgical History  Procedure Laterality Date  . Appendectomy    . Myomectomy    . Tubal ligation     No family history on file. History  Substance Use Topics  . Smoking status: Current Every Day Smoker -- 0.25 packs/day    Types: Cigarettes  . Smokeless tobacco: Not on file  . Alcohol Use: No   OB History   Grav Para Term Preterm Abortions TAB SAB Ect Mult Living   Review of Systems  All other systems reviewed and are negative.     Allergies  Aspirin and Other  Home Medications   Prior to Admission medications   Medication Sig Start Date End Date Taking? Authorizing Provider  albuterol (ACCUNEB) 1.25 MG/3ML nebulizer solution Take 1 ampule by nebulization every 6 (six) hours as needed. For shortness of breath and wheezing    Historical Provider, MD  amoxicillin (AMOXIL) 500 MG capsule Take  1 capsule (500 mg total) by mouth 2 (two) times daily. 08/29/12   Richardean Canal, MD  cephALEXin (KEFLEX) 500 MG capsule Take 1 capsule (500 mg total) by mouth 4 (four) times daily. 03/11/14   Vanetta Mulders, MD  HYDROcodone-acetaminophen (NORCO/VICODIN) 5-325 MG per tablet Take 1-2 tablets by mouth every 6 (six) hours as needed for moderate pain. 03/11/14   Vanetta Mulders, MD  ibuprofen (ADVIL,MOTRIN) 400 MG tablet Take 1 tablet (400 mg total) by mouth every 6 (six) hours as needed for pain. 06/25/12   Roxy Horseman, PA-C  Menthol, Topical Analgesic, 5 % PADS Apply 1 patch topically as needed. For pain     Historical Provider, MD  Multiple Vitamins-Minerals (ONE-A-DAY VITACRAVES) CHEW Chew 1 tablet by mouth daily.      Historical Provider, MD  ondansetron (ZOFRAN ODT) 4 MG disintegrating tablet Take 1 tablet (4 mg total) by mouth every 8 (eight) hours as needed for nausea or vomiting. 03/11/14   Vanetta Mulders, MD  ondansetron (ZOFRAN ODT) 4 MG disintegrating tablet Take 1 tablet (4 mg total) by mouth every 8 (eight) hours as needed for nausea or vomiting. 03/16/14   Teressa Lower, NP  oxyCODONE-acetaminophen (PERCOCET) 5-325 MG per tablet Take 2 tablets by mouth every 4 (four) hours as needed for pain. 08/29/12   Richardean Canal, MD  promethazine (PHENERGAN) 25 MG tablet Take 1 tablet (25 mg total) by mouth every 6 (six)  hours as needed for nausea or vomiting. 03/11/14   Vanetta Mulders, MD   BP 109/76  Pulse 88  Temp(Src) 98.8 F (37.1 C) (Oral)  Resp 16  Ht  (1.575 m)  Wt 120 lb (54.432 kg)  BMI 21.94 kg/m2  SpO2 97%  LMP 05/25/2014 Physical Exam  Nursing note and vitals reviewed. Constitutional: She is oriented to person, place, and time. She appears well-developed and well-nourished. No distress.  HENT:  Head: Normocephalic and atraumatic.  Eyes:  The bilateral conjunctiva are injected with a clear to yellow discharge. Bilateral corneas are clear and pupils are reactive.  Neck: Normal  range of motion. Neck supple.  Neurological: She is alert and oriented to person, place, and time.  Skin: Skin is warm and dry. She is not diaphoretic.    ED Course  Procedures (including critical care time) Labs Review Labs Reviewed - No data to display  Imaging Review No results found.   EKG Interpretation None      MDM   Final diagnoses:  None    We'll treat with gentamicin eyedrops for conjunctivitis. To return as needed if worsening or not improving.    Geoffery Lyons, MD 05/25/14 0830

## 2014-07-11 ENCOUNTER — Encounter (HOSPITAL_BASED_OUTPATIENT_CLINIC_OR_DEPARTMENT_OTHER): Payer: Self-pay | Admitting: Emergency Medicine

## 2014-08-30 ENCOUNTER — Emergency Department (HOSPITAL_BASED_OUTPATIENT_CLINIC_OR_DEPARTMENT_OTHER): Payer: Medicaid Other

## 2014-08-30 ENCOUNTER — Encounter (HOSPITAL_BASED_OUTPATIENT_CLINIC_OR_DEPARTMENT_OTHER): Payer: Self-pay | Admitting: Emergency Medicine

## 2014-08-30 ENCOUNTER — Emergency Department (HOSPITAL_BASED_OUTPATIENT_CLINIC_OR_DEPARTMENT_OTHER)
Admission: EM | Admit: 2014-08-30 | Discharge: 2014-08-30 | Disposition: A | Discharge status: Home / Self Care | Payer: Medicaid Other | Attending: Emergency Medicine | Admitting: Emergency Medicine

## 2014-08-30 DIAGNOSIS — Y990 Civilian activity done for income or pay: Secondary | ICD-10-CM | POA: Diagnosis not present

## 2014-08-30 DIAGNOSIS — X58XXXA Exposure to other specified factors, initial encounter: Secondary | ICD-10-CM | POA: Insufficient documentation

## 2014-08-30 DIAGNOSIS — Z79899 Other long term (current) drug therapy: Secondary | ICD-10-CM | POA: Insufficient documentation

## 2014-08-30 DIAGNOSIS — Z8742 Personal history of other diseases of the female genital tract: Secondary | ICD-10-CM | POA: Insufficient documentation

## 2014-08-30 DIAGNOSIS — N390 Urinary tract infection, site not specified: Secondary | ICD-10-CM | POA: Diagnosis not present

## 2014-08-30 DIAGNOSIS — Y9389 Activity, other specified: Secondary | ICD-10-CM | POA: Diagnosis not present

## 2014-08-30 DIAGNOSIS — M545 Low back pain, unspecified: Secondary | ICD-10-CM

## 2014-08-30 DIAGNOSIS — S39012A Strain of muscle, fascia and tendon of lower back, initial encounter: Secondary | ICD-10-CM

## 2014-08-30 DIAGNOSIS — Z3202 Encounter for pregnancy test, result negative: Secondary | ICD-10-CM | POA: Diagnosis not present

## 2014-08-30 DIAGNOSIS — Y9289 Other specified places as the place of occurrence of the external cause: Secondary | ICD-10-CM | POA: Diagnosis not present

## 2014-08-30 DIAGNOSIS — S3992XA Unspecified injury of lower back, initial encounter: Secondary | ICD-10-CM | POA: Diagnosis present

## 2014-08-30 DIAGNOSIS — Z720 Tobacco use: Secondary | ICD-10-CM | POA: Diagnosis not present

## 2014-08-30 LAB — URINALYSIS, ROUTINE W REFLEX MICROSCOPIC
Bilirubin Urine: NEGATIVE
Glucose, UA: NEGATIVE mg/dL
KETONES UR: NEGATIVE mg/dL
NITRITE: NEGATIVE
PH: 6.5 (ref 5.0–8.0)
PROTEIN: NEGATIVE mg/dL
Specific Gravity, Urine: 1.017 (ref 1.005–1.030)
UROBILINOGEN UA: 0.2 mg/dL (ref 0.0–1.0)

## 2014-08-30 LAB — PREGNANCY, URINE: PREG TEST UR: NEGATIVE

## 2014-08-30 LAB — URINE MICROSCOPIC-ADD ON

## 2014-08-30 MED ORDER — IBUPROFEN 800 MG PO TABS: 800.0000 mg | ORAL_TABLET | Freq: Three times a day (TID) | ORAL | Status: DC

## 2014-08-30 MED ORDER — CEPHALEXIN 500 MG PO CAPS: 500.0000 mg | ORAL_CAPSULE | Freq: Four times a day (QID) | ORAL | Status: DC

## 2014-08-30 MED ORDER — IBUPROFEN 800 MG PO TABS
800.0000 mg | ORAL_TABLET | Freq: Once | ORAL | Status: AC
  Administered 2014-08-30: 800 mg via ORAL
  Filled 2014-08-30: qty 1

## 2014-08-30 NOTE — ED Notes (Signed)
Patient reports that she is having pain in her back x 3 days. 1 time has had blood in her urine. The patient reports that the pain started after pulling a box at work

## 2014-08-30 NOTE — ED Notes (Addendum)
C/o middle to lower back pain x 3 days  Denies inj, increased urinary freq,  Denies dc  Has not taken anything for pain

## 2014-08-30 NOTE — Discharge Instructions (Signed)
Take antibiotic to completion for urinary tract infection. Rest, apply ice intermittently for the next 24 hours followed by heat. Avoid heavy lifting or hard physical activity. Take ibuprofen as prescribed for back pain.  Lumbosacral Strain Lumbosacral strain is a strain of any of the parts that make up your lumbosacral vertebrae. Your lumbosacral vertebrae are the bones that make up the lower third of your backbone. Your lumbosacral vertebrae are held together by muscles and tough, fibrous tissue (ligaments).  CAUSES  A sudden blow to your back can cause lumbosacral strain. Also, anything that causes an excessive stretch of the muscles in the low back can cause this strain. This is typically seen when people exert themselves strenuously, fall, lift heavy objects, bend, or crouch repeatedly. RISK FACTORS  Physically demanding work.  Participation in pushing or pulling sports or sports that require a sudden twist of the back (tennis, golf, baseball).  Weight lifting.  Excessive lower back curvature.  Forward-tilted pelvis.  Weak back or abdominal muscles or both.  Tight hamstrings. SIGNS AND SYMPTOMS  Lumbosacral strain may cause pain in the area of your injury or pain that moves (radiates) down your leg.  DIAGNOSIS Your health care provider can often diagnose lumbosacral strain through a physical exam. In some cases, you may need tests such as X-ray exams.  TREATMENT  Treatment for your lower back injury depends on many factors that your clinician will have to evaluate. However, most treatment will include the use of anti-inflammatory medicines. HOME CARE INSTRUCTIONS   Avoid hard physical activities (tennis, racquetball, waterskiing) if you are not in proper physical condition for it. This may aggravate or create problems.  If you have a back problem, avoid sports requiring sudden body movements. Swimming and walking are generally safer activities.  Maintain good  posture.  Maintain a healthy weight.  For acute conditions, you may put ice on the injured area.  Put ice in a plastic bag.  Place a towel between your skin and the bag.  Leave the ice on for 20 minutes, 2-3 times a day.  When the low back starts healing, stretching and strengthening exercises may be recommended. SEEK MEDICAL CARE IF:  Your back pain is getting worse.  You experience severe back pain not relieved with medicines. SEEK IMMEDIATE MEDICAL CARE IF:   You have numbness, tingling, weakness, or problems with the use of your arms or legs.  There is a change in bowel or bladder control.  You have increasing pain in any area of the body, including your belly (abdomen).  You notice shortness of breath, dizziness, or feel faint.  You feel sick to your stomach (nauseous), are throwing up (vomiting), or become sweaty.  You notice discoloration of your toes or legs, or your feet get very cold. MAKE SURE YOU:   Understand these instructions.  Will watch your condition.  Will get help right away if you are not doing well or get worse. Document Released: 06/05/2005 Document Revised: 08/31/2013 Document Reviewed: 04/14/2013 [facility] Patient Information 2015 [place], [place]. This information is not intended to replace advice given to you by your health care provider. Make sure you discuss any questions you have with your health care provider.  Urinary Tract Infection Urinary tract infections (UTIs) can develop anywhere along your urinary tract. Your urinary tract is your body's drainage system for removing wastes and extra water. Your urinary tract includes two kidneys, two ureters, a bladder, and a urethra. Your kidneys are a pair of bean-shaped organs. Each  kidney is about the size of your fist. They are located below your ribs, one on each side of your spine. CAUSES Infections are caused by microbes, which are microscopic organisms, including fungi, viruses, and bacteria.  These organisms are so small that they can only be seen through a microscope. Bacteria are the microbes that most commonly cause UTIs. SYMPTOMS  Symptoms of UTIs may vary by age and gender of the patient and by the location of the infection. Symptoms in young women typically include a frequent and intense urge to urinate and a painful, burning feeling in the bladder or urethra during urination. Older women and men are more likely to be tired, shaky, and weak and have muscle aches and abdominal pain. A fever may mean the infection is in your kidneys. Other symptoms of a kidney infection include pain in your back or sides below the ribs, nausea, and vomiting. DIAGNOSIS To diagnose a UTI, your caregiver will ask you about your symptoms. Your caregiver also will ask to provide a urine sample. The urine sample will be tested for bacteria and white blood cells. White blood cells are made by your body to help fight infection. TREATMENT  Typically, UTIs can be treated with medication. Because most UTIs are caused by a bacterial infection, they usually can be treated with the use of antibiotics. The choice of antibiotic and length of treatment depend on your symptoms and the type of bacteria causing your infection. HOME CARE INSTRUCTIONS  If you were prescribed antibiotics, take them exactly as your caregiver instructs you. Finish the medication even if you feel better after you have only taken some of the medication.  Drink enough water and fluids to keep your urine clear or pale yellow.  Avoid caffeine, tea, and carbonated beverages. They tend to irritate your bladder.  Empty your bladder often. Avoid holding urine for long periods of time.  Empty your bladder before and after sexual intercourse.  After a bowel movement, women should cleanse from front to back. Use each tissue only once. SEEK MEDICAL CARE IF:   You have back pain.  You develop a fever.  Your symptoms do not begin to resolve within  3 days. SEEK IMMEDIATE MEDICAL CARE IF:   You have severe back pain or lower abdominal pain.  You develop chills.  You have nausea or vomiting.  You have continued burning or discomfort with urination. MAKE SURE YOU:   Understand these instructions.  Will watch your condition.  Will get help right away if you are not doing well or get worse. Document Released: 06/05/2005 Document Revised: 02/25/2012 Document Reviewed: 10/04/2011 [facility] Patient Information 2015 [place], [place]. This information is not intended to replace advice given to you by your health care provider. Make sure you discuss any questions you have with your health care provider.
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
## 2014-10-31 ENCOUNTER — Encounter (HOSPITAL_BASED_OUTPATIENT_CLINIC_OR_DEPARTMENT_OTHER): Payer: Self-pay

## 2014-10-31 ENCOUNTER — Emergency Department (HOSPITAL_BASED_OUTPATIENT_CLINIC_OR_DEPARTMENT_OTHER)
Admission: EM | Admit: 2014-10-31 | Discharge: 2014-10-31 | Disposition: A | Discharge status: Home / Self Care | Payer: No Typology Code available for payment source | Attending: Emergency Medicine | Admitting: Emergency Medicine

## 2014-10-31 DIAGNOSIS — S0083XA Contusion of other part of head, initial encounter: Secondary | ICD-10-CM | POA: Insufficient documentation

## 2014-10-31 DIAGNOSIS — Z720 Tobacco use: Secondary | ICD-10-CM | POA: Insufficient documentation

## 2014-10-31 DIAGNOSIS — Y998 Other external cause status: Secondary | ICD-10-CM | POA: Diagnosis not present

## 2014-10-31 DIAGNOSIS — S60512A Abrasion of left hand, initial encounter: Secondary | ICD-10-CM | POA: Diagnosis not present

## 2014-10-31 DIAGNOSIS — S199XXA Unspecified injury of neck, initial encounter: Secondary | ICD-10-CM | POA: Diagnosis present

## 2014-10-31 DIAGNOSIS — S161XXA Strain of muscle, fascia and tendon at neck level, initial encounter: Secondary | ICD-10-CM | POA: Diagnosis not present

## 2014-10-31 DIAGNOSIS — Y9389 Activity, other specified: Secondary | ICD-10-CM | POA: Diagnosis not present

## 2014-10-31 DIAGNOSIS — Z880 Allergy status to penicillin: Secondary | ICD-10-CM | POA: Insufficient documentation

## 2014-10-31 DIAGNOSIS — H9209 Otalgia, unspecified ear: Secondary | ICD-10-CM | POA: Diagnosis not present

## 2014-10-31 DIAGNOSIS — Z8742 Personal history of other diseases of the female genital tract: Secondary | ICD-10-CM | POA: Diagnosis not present

## 2014-10-31 DIAGNOSIS — Y92410 Unspecified street and highway as the place of occurrence of the external cause: Secondary | ICD-10-CM | POA: Diagnosis not present

## 2014-10-31 MED ORDER — HYDROCODONE-ACETAMINOPHEN 5-325 MG PO TABS: 1.0000 | ORAL_TABLET | ORAL | Status: DC | PRN

## 2014-10-31 MED ORDER — CYCLOBENZAPRINE HCL 10 MG PO TABS: 10.0000 mg | ORAL_TABLET | Freq: Two times a day (BID) | ORAL | Status: DC | PRN

## 2014-10-31 NOTE — Discharge Instructions (Signed)
Abrasion An abrasion is a cut or scrape of the skin. Abrasions do not extend through all layers of the skin and most heal within 10 days. It is important to care for your abrasion properly to prevent infection. CAUSES  Most abrasions are caused by falling on, or gliding across, the ground or other surface. When your skin rubs on something, the outer and inner layer of skin rubs off, causing an abrasion. DIAGNOSIS  Your caregiver will be able to diagnose an abrasion during a physical exam.  TREATMENT  Your treatment depends on how large and deep the abrasion is. Generally, your abrasion will be cleaned with water and a mild soap to remove any dirt or debris. An antibiotic ointment may be put over the abrasion to prevent an infection. A bandage (dressing) may be wrapped around the abrasion to keep it from getting dirty.  You may need a tetanus shot if:  You cannot remember when you had your last tetanus shot.  You have never had a tetanus shot.  The injury broke your skin. If you get a tetanus shot, your arm may swell, get red, and feel warm to the touch. This is common and not a problem. If you need a tetanus shot and you choose not to have one, there is a rare chance of getting tetanus. Sickness from tetanus can be serious.  HOME CARE INSTRUCTIONS   If a dressing was applied, change it at least once a day or as directed by your caregiver. If the bandage sticks, soak it off with warm water.   Wash the area with water and a mild soap to remove all the ointment 2 times a day. Rinse off the soap and pat the area dry with a clean towel.   Reapply any ointment as directed by your caregiver. This will help prevent infection and keep the bandage from sticking. Use gauze over the wound and under the dressing to help keep the bandage from sticking.   Change your dressing right away if it becomes wet or dirty.   Only take over-the-counter or prescription medicines for pain, discomfort, or fever as  directed by your caregiver.   Follow up with your caregiver within 24-48 hours for a wound check, or as directed. If you were not given a wound-check appointment, look closely at your abrasion for redness, swelling, or pus. These are signs of infection. SEEK IMMEDIATE MEDICAL CARE IF:   You have increasing pain in the wound.   You have redness, swelling, or tenderness around the wound.   You have pus coming from the wound.   You have a fever or persistent symptoms for more than 2-3 days.  You have a fever and your symptoms suddenly get worse.  You have a bad smell coming from the wound or dressing.  MAKE SURE YOU:   Understand these instructions.  Will watch your condition.  Will get help right away if you are not doing well or get worse. Document Released: 06/05/2005 Document Revised: 08/12/2012 Document Reviewed: 07/30/2011 [facility] Patient Information 2015 [place], [place]. This information is not intended to replace advice given to you by your health care provider. Make sure you discuss any questions you have with your health care provider.  Cervical Sprain A cervical sprain is an injury in the neck in which the strong, fibrous tissues (ligaments) that connect your neck bones stretch or tear. Cervical sprains can range from mild to severe. Severe cervical sprains can cause the neck vertebrae to be unstable.  This can lead to damage of the spinal cord and can result in serious nervous system problems. The amount of time it takes for a cervical sprain to get better depends on the cause and extent of the injury. Most cervical sprains heal in 1 to 3 weeks. CAUSES  Severe cervical sprains may be caused by:   Contact sport injuries (such as from football, rugby, wrestling, hockey, auto racing, gymnastics, diving, martial arts, or boxing).   Motor vehicle collisions.   Whiplash injuries. This is an injury from a sudden forward and backward whipping movement of the head and  neck.  Falls.  Mild cervical sprains may be caused by:   Being in an awkward position, such as while cradling a telephone between your ear and shoulder.   Sitting in a chair that does not offer proper support.   Working at a poorly Marketing executivedesigned computer station.   Looking up or down for long periods of time.  SYMPTOMS   Pain, soreness, stiffness, or a burning sensation in the front, back, or sides of the neck. This discomfort may develop immediately after the injury or slowly, 24 hours or more after the injury.   Pain or tenderness directly in the middle of the back of the neck.   Shoulder or upper back pain.   Limited ability to move the neck.   Headache.   Dizziness.   Weakness, numbness, or tingling in the hands or arms.   Muscle spasms.   Difficulty swallowing or chewing.   Tenderness and swelling of the neck.  DIAGNOSIS  Most of the time your health care provider can diagnose a cervical sprain by taking your history and doing a physical exam. Your health care provider will ask about previous neck injuries and any known neck problems, such as arthritis in the neck. X-rays may be taken to find out if there are any other problems, such as with the bones of the neck. Other tests, such as a CT scan or MRI, may also be needed.  TREATMENT  Treatment depends on the severity of the cervical sprain. Mild sprains can be treated with rest, keeping the neck in place (immobilization), and pain medicines. Severe cervical sprains are immediately immobilized. Further treatment is done to help with pain, muscle spasms, and other symptoms and may include:  Medicines, such as pain relievers, numbing medicines, or muscle relaxants.   Physical therapy. This may involve stretching exercises, strengthening exercises, and posture training. Exercises and improved posture can help stabilize the neck, strengthen muscles, and help stop symptoms from returning.  HOME CARE INSTRUCTIONS    Put ice on the injured area.   Put ice in a plastic bag.   Place a towel between your skin and the bag.   Leave the ice on for 15-20 minutes, 3-4 times a day.   If your injury was severe, you may have been given a cervical collar to wear. A cervical collar is a two-piece collar designed to keep your neck from moving while it heals.  Do not remove the collar unless instructed by your health care provider.  If you have long hair, keep it outside of the collar.  Ask your health care provider before making any adjustments to your collar. Minor adjustments may be required over time to improve comfort and reduce pressure on your chin or on the back of your head.  Ifyou are allowed to remove the collar for cleaning or bathing, follow your health care provider's instructions on how to do  so safely.  Keep your collar clean by wiping it with mild soap and water and drying it completely. If the collar you have been given includes removable pads, remove them every 1-2 days and hand wash them with soap and water. Allow them to air dry. They should be completely dry before you wear them in the collar.  If you are allowed to remove the collar for cleaning and bathing, wash and dry the skin of your neck. Check your skin for irritation or sores. If you see any, tell your health care provider.  Do not drive while wearing the collar.   Only take over-the-counter or prescription medicines for pain, discomfort, or fever as directed by your health care provider.   Keep all follow-up appointments as directed by your health care provider.   Keep all physical therapy appointments as directed by your health care provider.   Make any needed adjustments to your workstation to promote good posture.   Avoid positions and activities that make your symptoms worse.   Warm up and stretch before being active to help prevent problems.  SEEK MEDICAL CARE IF:   Your pain is not controlled with  medicine.   You are unable to decrease your pain medicine over time as planned.   Your activity level is not improving as expected.  SEEK IMMEDIATE MEDICAL CARE IF:   You develop any bleeding.  You develop stomach upset.  You have signs of an allergic reaction to your medicine.   Your symptoms get worse.   You develop new, unexplained symptoms.   You have numbness, tingling, weakness, or paralysis in any part of your body.  MAKE SURE YOU:   Understand these instructions.  Will watch your condition.  Will get help right away if you are not doing well or get worse. Document Released: 06/23/2007 Document Revised: 08/31/2013 Document Reviewed: 03/03/2013 [facility] Patient Information 2015 [place], [place]. This information is not intended to replace advice given to you by your health care provider. Make sure you discuss any questions you have with your health care provider.  Facial or Scalp Contusion A facial or scalp contusion is a deep bruise on the face or head. Injuries to the face and head generally cause a lot of swelling, especially around the eyes. Contusions are the result of an injury that caused bleeding under the skin. The contusion may turn blue, purple, or yellow. Minor injuries will give you a painless contusion, but more severe contusions may stay painful and swollen for a few weeks.  CAUSES  A facial or scalp contusion is caused by a blunt injury or trauma to the face or head area.  SIGNS AND SYMPTOMS   Swelling of the injured area.   Discoloration of the injured area.   Tenderness, soreness, or pain in the injured area.  DIAGNOSIS  The diagnosis can be made by taking a medical history and doing a physical exam. An X-ray exam, CT scan, or MRI may be needed to determine if there are any associated injuries, such as broken bones (fractures). TREATMENT  Often, the best treatment for a facial or scalp contusion is applying cold compresses to the injured  area. Over-the-counter medicines may also be recommended for pain control.  HOME CARE INSTRUCTIONS   Only take over-the-counter or prescription medicines as directed by your health care provider.   Apply ice to the injured area.   Put ice in a plastic bag.   Place a towel between your skin and the bag.  Leave the ice on for 20 minutes, 2-3 times a day.  SEEK MEDICAL CARE IF:  You have bite problems.   You have pain with chewing.   You are concerned about facial defects. SEEK IMMEDIATE MEDICAL CARE IF:  You have severe pain or a headache that is not relieved by medicine.   You have unusual sleepiness, confusion, or personality changes.   You throw up (vomit).   You have a persistent nosebleed.   You have double vision or blurred vision.   You have fluid drainage from your nose or ear.   You have difficulty walking or using your arms or legs.  MAKE SURE YOU:   Understand these instructions.  Will watch your condition.  Will get help right away if you are not doing well or get worse. Document Released: 10/03/2004 Document Revised: 06/16/2013 Document Reviewed: 04/08/2013 [facility] Patient Information 2015 [place], [place]. This information is not intended to replace advice given to you by your health care provider. Make sure you discuss any questions you have with your health care provider.
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
## 2015-10-24 ENCOUNTER — Encounter (HOSPITAL_BASED_OUTPATIENT_CLINIC_OR_DEPARTMENT_OTHER): Payer: Self-pay | Admitting: *Deleted

## 2015-10-24 ENCOUNTER — Emergency Department (HOSPITAL_BASED_OUTPATIENT_CLINIC_OR_DEPARTMENT_OTHER)
Admission: EM | Admit: 2015-10-24 | Discharge: 2015-10-24 | Disposition: A | Discharge status: Home / Self Care | Payer: Medicaid Other | Attending: Emergency Medicine | Admitting: Emergency Medicine

## 2015-10-24 DIAGNOSIS — R51 Headache: Secondary | ICD-10-CM | POA: Diagnosis not present

## 2015-10-24 DIAGNOSIS — K1379 Other lesions of oral mucosa: Secondary | ICD-10-CM | POA: Insufficient documentation

## 2015-10-24 DIAGNOSIS — Z880 Allergy status to penicillin: Secondary | ICD-10-CM | POA: Diagnosis not present

## 2015-10-24 DIAGNOSIS — K0510 Chronic gingivitis, plaque induced: Secondary | ICD-10-CM | POA: Diagnosis not present

## 2015-10-24 DIAGNOSIS — Z8742 Personal history of other diseases of the female genital tract: Secondary | ICD-10-CM | POA: Insufficient documentation

## 2015-10-24 DIAGNOSIS — K08409 Partial loss of teeth, unspecified cause, unspecified class: Secondary | ICD-10-CM | POA: Diagnosis not present

## 2015-10-24 DIAGNOSIS — F17210 Nicotine dependence, cigarettes, uncomplicated: Secondary | ICD-10-CM | POA: Insufficient documentation

## 2015-10-24 DIAGNOSIS — K068 Other specified disorders of gingiva and edentulous alveolar ridge: Secondary | ICD-10-CM | POA: Diagnosis present

## 2015-10-24 MED ORDER — PENICILLIN V POTASSIUM 500 MG PO TABS: 500.0000 mg | ORAL_TABLET | Freq: Three times a day (TID) | ORAL | Status: AC

## 2015-10-24 MED ORDER — HYDROCODONE-ACETAMINOPHEN 5-325 MG PO TABS
1.0000 | ORAL_TABLET | Freq: Four times a day (QID) | ORAL | Status: DC | PRN
Start: 2015-10-24 — End: 2016-09-02

## 2015-10-24 MED FILL — HYDROCODON-APAP 5-325: 5-325 | 2 days supply | Qty: 8 | Fill #0

## 2015-10-24 MED FILL — PENICILLIN VK 500 MG TABLET: 500 | 7 days supply | Qty: 21 | Fill #0

## 2015-10-24 NOTE — ED Provider Notes (Signed)
CSN: [id number]     Arrival date & time 10/24/15  1329 History   First MD Initiated Contact with Patient 10/24/15 1552     No chief complaint on file.   Chief complaint mouth pain (Consider location/radiation/quality/duration/timing/severity/associated sxs/prior Treatment) HPI Patient reports pain at upper gingiva at site of dental extractions teeth #15 and 16. Those 2 teeth were extracted 08/29/2015. She's experienced increasing pain over the past 2 days. She denies fever. Denies other associated symptoms pain is worse with clenching her teeth shut denies fever. She is treated herself with ibuprofen 800 mg, without adequate pain relief. Past Medical History  Diagnosis Date  . Ovarian cyst    Past Surgical History  Procedure Laterality Date  . Appendectomy    . Myomectomy    . Tubal ligation     No family history on file. Social History  Substance Use Topics  . Smoking status: Current Every Day Smoker -- 0.25 packs/day    Types: Cigarettes  . Smokeless tobacco: None  . Alcohol Use: No   OB History    Gravida Para Term Preterm AB TAB SAB Ectopic Multiple Living   Review of Systems  Constitutional: Negative.   HENT:       Mouth pain  Neurological: Positive for headaches.      Allergies  Penicillins; Aspirin; and Other  Home Medications   Prior to Admission medications   Medication Sig Start Date End Date Taking? Authorizing Provider  cyclobenzaprine (FLEXERIL) 10 MG tablet Take 1 tablet (10 mg total) by mouth 2 (two) times daily as needed for muscle spasms. 10/31/14   Rolan Bucco, MD  HYDROcodone-acetaminophen (NORCO/VICODIN) 5-325 MG per tablet Take 1-2 tablets by mouth every 4 (four) hours as needed. 10/31/14   Rolan Bucco, MD   BP 115/82 mmHg  Pulse 73  Temp(Src) 98.3 F (36.8 C) (Oral)  Resp 18  Ht  (1.575 m)  Wt 125 lb (56.7 kg)  BMI 22.86 kg/m2  SpO2 100%  LMP 10/17/2015 Physical Exam  Constitutional: She is oriented to  person, place, and time. She appears well-developed and well-nourished. She appears distressed.  HENT:  Helling secretions well. No trismus. She is tender at gingiva at site of extractions teeth #15 and 16. No fluctuance of gingiva.  Cardiovascular: Normal rate.   Pulmonary/Chest: Effort normal.  Abdominal: She exhibits no distension.  Musculoskeletal: Normal range of motion.  Neurological: She is oriented to person, place, and time.  Psychiatric: She has a normal mood and affect.    ED Course  Procedures (including critical care time) Labs Review Labs Reviewed - No data to display  Imaging Review No results found. I have personally reviewed and evaluated these images and lab results as part of my medical decision-making.   EKG Interpretation None      MDM  Plan prescription Pen-Vee K, Norco. She is instructed to follow up with her oral surgeon or dentist scheduled next available appointment. Final diagnoses:  None   Diagnosis gingivitis     Doug Sou, MD 10/24/15 1626

## 2015-10-24 NOTE — ED Notes (Signed)
MD at bedside. 

## 2015-10-24 NOTE — ED Notes (Signed)
Oral surgery in December. Mouth pain. Swelling to the left side of the top of her mouth. Headache.

## 2015-10-24 NOTE — Discharge Instructions (Signed)
Gingivitis Take Advil for mild pain or the pain medicine prescribed for bad pain. Dissolve 1/4 tablespoon of salt in 8 ounces of warm water and rinse your mouth out 4 times daily.   Call your oral surgeon or dentist tomorrow to schedule next available appointment. Gingivitis is a form of gum (periodontal) disease that causes redness, soreness, and swelling (inflammation) of your gums. CAUSES The most common cause of gingivitis is poor oral hygiene. A sticky substance made of bacteria, mucus, and food particles (plaque), is deposited on the exposed part of teeth. As plaque builds up, it reacts with the saliva in your mouth to form something called  tartar. Tartar is a hard deposit that becomes trapped around the base of the tooth. Plaque and tartar irritate the gums, leading to the formation of gingivitis. Other factors that increase your risk for gingivitis include:   Tobacco use.  Diabetes.  Older age.  Certain medications.  Certain viral or fungal infections.  Dry mouth.  Hormonal changes such as during pregnancy.  Poor nutrition.  Substance abuse.  Poor fitting dental restorations or appliances. SYMPTOMS You may notice inflammation of the soft tissue (gingiva) around the teeth. When these tissues become inflamed, they bleed easily, especially during flossing or brushing. The gums may also be:   Tender to the touch.  Bright red, purple red, or have a shiny appearance.  Swollen.  Wearing away from the teeth (receding), which exposes more of the tooth. Bad breath is often present. Continued infection around teeth can eventually cause cavities and loosen teeth. This may lead to eventual tooth loss. DIAGNOSIS A medical and dental history will be taken. Your mouth, teeth, and gums will be examined. Your dentist will look for soft, swollen purple-red, irritated gums. There may be deposits of plaque and tartar at the base of the teeth. Your gums will be looked at for the degree of  redness, puffiness, and bleeding tendencies. Your dentist will see if any of the teeth are loose. X-rays may be taken to see if the inflammation has spread to the supporting structures of the teeth. TREATMENT The goal is to reduce and reverse the inflammation. Proper treatment can usually reverse the symptoms of gingivitis and prevent further progression of the disease. Have your teeth cleaned. During the cleaning, all plaque and tartar will be removed. Instruction for proper home care will be given. You will need regular professional cleanings and check-ups in the future. HOME CARE INSTRUCTIONS  Brush your teeth twice a day and floss at least once per day. When flossing, it is best to floss first then brush.  Limit sugar between meals and maintain a well-balanced diet.  Even the best dental hygiene will not prevent plaque from developing. It is necessary for you to see your dentist on a regular basis for cleaning and regular checkups.  Your dentist can recommend proper oral hygiene and mouth care and suggest special toothpastes or mouth rinses.  Stop smoking. SEEK DENTAL OR MEDICAL CARE IF:  You have painful, reddened tissue around your teeth, or you have puffy swollen gums.  You have difficulty chewing.  You notice any loose or infected teeth.  You have swollen glands.  Your gums bleed easily when you brush your teeth or are very tender to the touch.   This information is not intended to replace advice given to you by your health care provider. Make sure you discuss any questions you have with your health care provider.   Document Released: 02/19/2001 Document  Revised: 11/18/2011 Document Reviewed: 04/10/2015 Elsevier Interactive Patient Education [employer]

## 2016-02-12 ENCOUNTER — Emergency Department (HOSPITAL_BASED_OUTPATIENT_CLINIC_OR_DEPARTMENT_OTHER)
Admission: EM | Admit: 2016-02-12 | Discharge: 2016-02-12 | Disposition: A | Discharge status: Home / Self Care | Payer: Medicaid Other | Attending: Emergency Medicine | Admitting: Emergency Medicine

## 2016-02-12 ENCOUNTER — Emergency Department (HOSPITAL_BASED_OUTPATIENT_CLINIC_OR_DEPARTMENT_OTHER): Payer: Medicaid Other

## 2016-02-12 ENCOUNTER — Encounter (HOSPITAL_BASED_OUTPATIENT_CLINIC_OR_DEPARTMENT_OTHER): Payer: Self-pay | Admitting: Emergency Medicine

## 2016-02-12 DIAGNOSIS — R519 Headache, unspecified: Secondary | ICD-10-CM

## 2016-02-12 DIAGNOSIS — R112 Nausea with vomiting, unspecified: Secondary | ICD-10-CM | POA: Diagnosis not present

## 2016-02-12 DIAGNOSIS — G43909 Migraine, unspecified, not intractable, without status migrainosus: Secondary | ICD-10-CM | POA: Diagnosis present

## 2016-02-12 DIAGNOSIS — F17210 Nicotine dependence, cigarettes, uncomplicated: Secondary | ICD-10-CM | POA: Diagnosis not present

## 2016-02-12 DIAGNOSIS — R51 Headache: Secondary | ICD-10-CM | POA: Diagnosis not present

## 2016-02-12 MED ORDER — ACETAMINOPHEN 500 MG PO TABS: 500.0000 mg | ORAL_TABLET | Freq: Four times a day (QID) | ORAL | Status: DC | PRN

## 2016-02-12 MED ORDER — SODIUM CHLORIDE 0.9 % IV SOLN
1000.0000 mL | Freq: Once | INTRAVENOUS | Status: AC
  Administered 2016-02-12: 1000 mL via INTRAVENOUS

## 2016-02-12 MED ORDER — DIPHENHYDRAMINE HCL 50 MG/ML IJ SOLN
25.0000 mg | Freq: Once | INTRAMUSCULAR | Status: AC
  Administered 2016-02-12: 25 mg via INTRAVENOUS
  Filled 2016-02-12: qty 1

## 2016-02-12 MED ORDER — PROCHLORPERAZINE EDISYLATE 5 MG/ML IJ SOLN
10.0000 mg | Freq: Once | INTRAMUSCULAR | Status: AC
  Administered 2016-02-12: 10 mg via INTRAVENOUS
  Filled 2016-02-12: qty 2

## 2016-02-12 NOTE — ED Notes (Signed)
Pt verbalizes understanding of d/c instructions and denies any further needs at this time. 

## 2016-02-12 NOTE — ED Notes (Signed)
Headache with vomiting x 2 days

## 2016-02-12 NOTE — ED Notes (Signed)
All of triage assessment done by me ,Lonna DuvalKaren Paola Flynt RN,

## 2016-02-12 NOTE — ED Notes (Signed)
MD at bedside. 

## 2016-02-12 NOTE — Discharge Instructions (Signed)
You may take Tylenol as instructed for your headache.  Please call to schedule an appointment with Neurology within 1 week.   Migraine Headache A migraine headache is an intense, throbbing pain on one or both sides of your head. A migraine can last for 30 minutes to several hours. CAUSES  The exact cause of a migraine headache is not always known. However, a migraine may be caused when nerves in the brain become irritated and release chemicals that cause inflammation. This causes pain. Certain things may also trigger migraines, such as:  Alcohol.  Smoking.  Stress.  Menstruation.  Aged cheeses.  Foods or drinks that contain nitrates, glutamate, aspartame, or tyramine.  Lack of sleep.  Chocolate.  Caffeine.  Hunger.  Physical exertion.  Fatigue.  Medicines used to treat chest pain (nitroglycerine), birth control pills, estrogen, and some blood pressure medicines. SIGNS AND SYMPTOMS  Pain on one or both sides of your head.  Pulsating or throbbing pain.  Severe pain that prevents daily activities.  Pain that is aggravated by any physical activity.  Nausea, vomiting, or both.  Dizziness.  Pain with exposure to bright lights, loud noises, or activity.  General sensitivity to bright lights, loud noises, or smells. Before you get a migraine, you may get warning signs that a migraine is coming (aura). An aura may include:  Seeing flashing lights.  Seeing bright spots, halos, or zigzag lines.  Having tunnel vision or blurred vision.  Having feelings of numbness or tingling.  Having trouble talking.  Having muscle weakness. DIAGNOSIS  A migraine headache is often diagnosed based on:  Symptoms.  Physical exam.  A CT scan or MRI of your head. These imaging tests cannot diagnose migraines, but they can help rule out other causes of headaches. TREATMENT Medicines may be given for pain and nausea. Medicines can also be given to help prevent recurrent  migraines.  HOME CARE INSTRUCTIONS  Only take over-the-counter or prescription medicines for pain or discomfort as directed by your health care provider. The use of long-term narcotics is not recommended.  Lie down in a dark, quiet room when you have a migraine.  Keep a journal to find out what may trigger your migraine headaches. For example, write down:  What you eat and drink.  How much sleep you get.  Any change to your diet or medicines.  Limit alcohol consumption.  Quit smoking if you smoke.  Get 7-9 hours of sleep, or as recommended by your health care provider.  Limit stress.  Keep lights dim if bright lights bother you and make your migraines worse. SEEK IMMEDIATE MEDICAL CARE IF:   Your migraine becomes severe.  You have a fever.  You have a stiff neck.  You have vision loss.  You have muscular weakness or loss of muscle control.  You start losing your balance or have trouble walking.  You feel faint or pass out.  You have severe symptoms that are different from your first symptoms. MAKE SURE YOU:   Understand these instructions.  Will watch your condition.  Will get help right away if you are not doing well or get worse.   This information is not intended to replace advice given to you by your health care provider. Make sure you discuss any questions you have with your health care provider.   Document Released: 08/26/2005 Document Revised: 09/16/2014 Document Reviewed: 05/03/2013 Elsevier Interactive Patient Education [employer]

## 2016-02-12 NOTE — ED Notes (Signed)
MD, Midlevel at bedside.

## 2016-02-12 NOTE — ED Provider Notes (Signed)
CSN: [id number]     Arrival date & time 02/12/16  1633 History   First MD Initiated Contact with Patient 02/12/16 1639     Chief Complaint  Patient presents with  . Migraine     (Consider location/radiation/quality/duration/timing/severity/associated sxs/prior Treatment) Patient is a 30 y.o. female presenting with migraines and headaches.  Migraine Associated symptoms include headaches, nausea and vomiting. Pertinent negatives include no abdominal pain, congestion, coughing, fatigue, fever, myalgias, neck pain, numbness, sore throat, visual change or weakness.  Headache Pain location:  Frontal Quality:  Dull Radiates to:  L neck, R neck, face and eyes Severity currently:  7/10 Severity at highest:  10/10 Onset quality:  Gradual Duration:  2 days Timing:  Constant Progression:  Worsening Chronicity:  New Similar to prior headaches: no   Context: activity, bright light and loud noise   Relieved by:  Nothing Worsened by:  Light and sound Ineffective treatments:  Acetaminophen and NSAIDs Associated symptoms: eye pain, facial pain, nausea, photophobia and vomiting   Associated symptoms: no abdominal pain, no back pain, no blurred vision, no congestion, no cough, no diarrhea, no dizziness, no drainage, no ear pain, no fatigue, no fever, no focal weakness, no hearing loss, no loss of balance, no myalgias, no near-syncope, no neck pain, no neck stiffness, no numbness, no paresthesias, no seizures, no sinus pressure, no sore throat, no syncope, no tingling, no visual change and no weakness   Nausea:    Duration:  2 days   Timing:  Intermittent Vomiting:    Duration:  2 days   Timing:  Intermittent Patient denies any life stressors but states she has been taking 2 antidepressants for the past one month - Citalopram and Amitriptyline.  Reports seeing "purple-green spots" in her vision.   Past Medical History  Diagnosis Date  . Ovarian cyst    Past Surgical History  Procedure  Laterality Date  . Appendectomy    . Myomectomy    . Tubal ligation     No family history on file. Social History  Substance Use Topics  . Smoking status: Current Every Day Smoker -- 0.25 packs/day    Types: Cigarettes  . Smokeless tobacco: None  . Alcohol Use: No   OB History    Gravida Para Term Preterm AB TAB SAB Ectopic Multiple Living   5 4 2 2 1  1   2      Review of Systems  Constitutional: Negative for fever and fatigue.  HENT: Negative for congestion, ear pain, hearing loss, postnasal drip, sinus pressure and sore throat.   Eyes: Positive for photophobia and pain. Negative for blurred vision.  Respiratory: Negative for cough.   Cardiovascular: Negative for syncope and near-syncope.  Gastrointestinal: Positive for nausea and vomiting. Negative for abdominal pain and diarrhea.  Musculoskeletal: Negative for myalgias, back pain, neck pain and neck stiffness.  Neurological: Positive for headaches. Negative for dizziness, focal weakness, seizures, weakness, numbness, paresthesias and loss of balance.      Allergies  Aspirin and Other  Home Medications   Prior to Admission medications   Medication Sig Start Date End Date Taking? Authorizing Provider  cyclobenzaprine (FLEXERIL) 10 MG tablet Take 1 tablet (10 mg total) by mouth 2 (two) times daily as needed for muscle spasms. 10/31/14   Rolan BuccoMelanie Belfi, MD  HYDROcodone-acetaminophen (NORCO) 5-325 MG tablet Take 1 tablet by mouth every 6 (six) hours as needed for severe pain. 10/24/15   Doug SouSam Jacubowitz, MD   BP 113/81 mmHg  Pulse 61  Temp(Src) 98.6 F (37 C) (Oral)  Resp 20  Ht  (1.575 m)  Wt 53.524 kg  BMI 21.58 kg/m2  SpO2 100% Physical Exam  Constitutional: She is oriented to person, place, and time. She appears well-developed and well-nourished.  Sitting up in bed with hands covering eyes.   HENT:  Head: Normocephalic and atraumatic.  Mouth/Throat: Oropharynx is clear and moist.  Eyes: EOM are normal. Pupils  are equal, round, and reactive to light.  Neck: Normal range of motion. Neck supple. No tracheal deviation present.  Cardiovascular: Normal rate, regular rhythm and intact distal pulses.   Pulmonary/Chest: Effort normal and breath sounds normal. No respiratory distress. She has no wheezes. She has no rales.  Abdominal: Soft. Bowel sounds are normal. She exhibits no distension. There is no tenderness. There is no guarding.  Musculoskeletal: She exhibits no edema.  Neurological: She is alert and oriented to person, place, and time. No cranial nerve deficit. Coordination normal.  Strength 5/5 and sensation to light touch intact in bilateral upper and lower extremities.  Kernig's sign negative Brudzinski's sign negative   Skin: Skin is warm and dry. No rash noted. No erythema.    ED Course  Procedures (including critical care time) Labs Review Labs Reviewed - No data to display  Imaging Review No results found. I have personally reviewed and evaluated these images and lab results as part of my medical decision-making.   EKG Interpretation None      MDM   Final diagnoses:  None   Patient is presenting with a 2 day history of a severe headache which is frontal, radiates to face a/w pain in eyes. Also a/w photophobia, phonophobia, nausea, and vomiting. Not likely meningitis as patient does not neck stiffness, Kernigs and Brudzinski's signs negative. Neuro exam is non-focal. BP is stable. However, patient reports never having such a severe headache before.  -Ordering CT of head to rule a possible mass -EKG is not showing QTc prolongation (QTc 351), give Compazine 10 mg IV -Benadryl 25 mg IV -1L bolus of NS  6:55 pm: CT head negative   7:50 pm: Patient states her headache has improved significantly. She is stable for discharge and has been advised to follow-up with neurology within 1 week.        John Giovanni, MD 02/12/16 1951  Marily Memos, MD 02/13/16 [phone]

## 2016-09-02 ENCOUNTER — Emergency Department (HOSPITAL_BASED_OUTPATIENT_CLINIC_OR_DEPARTMENT_OTHER): Payer: Medicaid Other

## 2016-09-02 ENCOUNTER — Encounter (HOSPITAL_BASED_OUTPATIENT_CLINIC_OR_DEPARTMENT_OTHER): Payer: Self-pay

## 2016-09-02 ENCOUNTER — Emergency Department (HOSPITAL_BASED_OUTPATIENT_CLINIC_OR_DEPARTMENT_OTHER)
Admission: EM | Admit: 2016-09-02 | Discharge: 2016-09-02 | Disposition: A | Discharge status: Home / Self Care | Payer: Medicaid Other | Attending: Emergency Medicine | Admitting: Emergency Medicine

## 2016-09-02 DIAGNOSIS — F17210 Nicotine dependence, cigarettes, uncomplicated: Secondary | ICD-10-CM | POA: Diagnosis not present

## 2016-09-02 DIAGNOSIS — R1030 Lower abdominal pain, unspecified: Secondary | ICD-10-CM | POA: Diagnosis present

## 2016-09-02 DIAGNOSIS — R102 Pelvic and perineal pain: Secondary | ICD-10-CM | POA: Diagnosis not present

## 2016-09-02 DIAGNOSIS — N939 Abnormal uterine and vaginal bleeding, unspecified: Secondary | ICD-10-CM | POA: Diagnosis not present

## 2016-09-02 LAB — URINALYSIS, ROUTINE W REFLEX MICROSCOPIC
BILIRUBIN URINE: NEGATIVE
GLUCOSE, UA: NEGATIVE mg/dL
Ketones, ur: NEGATIVE mg/dL
NITRITE: NEGATIVE
PH: 5.5 (ref 5.0–8.0)
Protein, ur: 100 mg/dL — AB
Specific Gravity, Urine: 1.021 (ref 1.005–1.030)

## 2016-09-02 LAB — CBC WITH DIFFERENTIAL/PLATELET
Basophils Absolute: 0 10*3/uL (ref 0.0–0.1)
Basophils Relative: 0 %
Eosinophils Absolute: 0.1 10*3/uL (ref 0.0–0.7)
Eosinophils Relative: 1 %
HEMATOCRIT: 41.9 % (ref 36.0–46.0)
HEMOGLOBIN: 14 g/dL (ref 12.0–15.0)
LYMPHS ABS: 2.2 10*3/uL (ref 0.7–4.0)
Lymphocytes Relative: 29 %
MCH: 31.6 pg (ref 26.0–34.0)
MCHC: 33.4 g/dL (ref 30.0–36.0)
MCV: 94.6 fL (ref 78.0–100.0)
MONOS PCT: 10 %
Monocytes Absolute: 0.8 10*3/uL (ref 0.1–1.0)
NEUTROS ABS: 4.6 10*3/uL (ref 1.7–7.7)
NEUTROS PCT: 60 %
Platelets: 324 10*3/uL (ref 150–400)
RBC: 4.43 MIL/uL (ref 3.87–5.11)
RDW: 12.3 % (ref 11.5–15.5)
WBC: 7.7 10*3/uL (ref 4.0–10.5)

## 2016-09-02 LAB — BASIC METABOLIC PANEL
ANION GAP: 8 (ref 5–15)
BUN: 8 mg/dL (ref 6–20)
CHLORIDE: 106 mmol/L (ref 101–111)
CO2: 25 mmol/L (ref 22–32)
Calcium: 9.7 mg/dL (ref 8.9–10.3)
Creatinine, Ser: 0.7 mg/dL (ref 0.44–1.00)
GFR calc non Af Amer: >60 mL/min (ref >60)
Glucose, Bld: 98 mg/dL (ref 65–99)
Potassium: 3.4 mmol/L — ABNORMAL LOW (ref 3.5–5.1)
Sodium: 139 mmol/L (ref 135–145)

## 2016-09-02 LAB — URINALYSIS, MICROSCOPIC (REFLEX)

## 2016-09-02 LAB — PREGNANCY, URINE: Preg Test, Ur: NEGATIVE

## 2016-09-02 MED ORDER — KETOROLAC TROMETHAMINE 60 MG/2ML IM SOLN
60.0000 mg | Freq: Once | INTRAMUSCULAR | Status: AC
  Administered 2016-09-02: 60 mg via INTRAMUSCULAR
  Filled 2016-09-02: qty 2

## 2016-09-02 MED ORDER — TRAMADOL HCL 50 MG PO TABS: 50.0000 mg | ORAL_TABLET | Freq: Four times a day (QID) | ORAL | 0 refills | Status: DC | PRN

## 2016-09-02 NOTE — ED Provider Notes (Signed)
[facility] Provider Note   CSN: [id number] Arrival date & time: 09/02/16  1455   By signing my name below, I, Alicia Brown, attest that this documentation has been prepared under the direction and in the presence of Geoffery Lyonsouglas Toriana Sponsel, MD. Electronically Signed: Cynda AcresHailei Brown, Scribe. 09/02/16. 4:17 PM.  History   Chief Complaint Chief Complaint  Patient presents with  . Abdominal Pain   HPI Comments: Alicia Brown is a 30 y.o. female who presents to the Emergency Department complaining of gradually worsening abdominal cramps that began last night. She describes the pain as "throbbing, achy". Patient has associated vaginal bleeding, chills, and right lower back pain. Her LNMP was about 1.5 months ago. She denies any fever, dysuria, vaginal discharge, and any new sexual partners.    Abdominal Pain   This is a new problem. The current episode started 2 days ago. The problem occurs constantly. The problem has been gradually worsening. The pain is located in the suprapubic region. The symptoms are aggravated by palpation (touch ). Nothing relieves the symptoms.    Past Medical History:  Diagnosis Date  . Ovarian cyst     There are no active problems to display for this patient.   Past Surgical History:  Procedure Laterality Date  . APPENDECTOMY    . MYOMECTOMY    . TUBAL LIGATION      OB History    Gravida Para Term Preterm AB Living   5 4 2 2 1 2    SAB TAB Ectopic Multiple Live Births   1               Home Medications    Prior to Admission medications   Medication Sig Start Date End Date Taking? Authorizing Provider  Citalopram Hydrobromide (CELEXA PO) Take by mouth.   Yes Historical Provider, MD  TRAZODONE HCL PO Take by mouth.   Yes Historical Provider, MD    Family History No family history on file.  Social History Social History  Substance Use Topics  . Smoking status: Current Every Day Smoker    Packs/day: 0.25    Types: Cigarettes  .  Smokeless tobacco: Never Used  . Alcohol use Yes     Comment: occ     Allergies   Aspirin and Other   Review of Systems Review of Systems  Gastrointestinal: Positive for abdominal pain.  All other systems reviewed and are negative.    Physical Exam Updated Vital Signs BP 118/86 (BP Location: Left Arm)   Pulse 78   Temp 98.3 F (36.8 C) (Oral)   Resp 16   Ht 5\' 2"  (1.575 m)   Wt 122 lb (55.3 kg)   LMP 07/14/2016   SpO2 100%   BMI 22.31 kg/m   Physical Exam  Constitutional: She is oriented to person, place, and time. She appears well-developed and well-nourished. No distress.  HENT:  Head: Normocephalic and atraumatic.  Neck: Normal range of motion. Neck supple.  Cardiovascular: Normal rate, regular rhythm and normal heart sounds.   No murmur heard. Pulmonary/Chest: Effort normal. No respiratory distress. She has no rales.  Abdominal: Soft. Bowel sounds are normal. She exhibits no distension. There is tenderness. There is no rebound and no guarding.  Mild  suprapubic TTP   Musculoskeletal: Normal range of motion.  Neurological: She is alert and oriented to person, place, and time.  Skin: Skin is warm and dry. She is not diaphoretic.     ED Treatments / Results  DIAGNOSTIC STUDIES:  Oxygen Saturation is 100% on RA, normal by my interpretation.    COORDINATION OF CARE: 4:17 PM Discussed treatment plan with pt at bedside and pt agreed to plan.  Labs (all labs ordered are listed, but only abnormal results are displayed) Labs Reviewed  URINALYSIS, ROUTINE W REFLEX MICROSCOPIC - Abnormal; Notable for the following:       Result Value   Color, Urine RED (*)    APPearance CLOUDY (*)    Hgb urine dipstick LARGE (*)    Protein, ur 100 (*)    Leukocytes, UA SMALL (*)    All other components within normal limits  URINALYSIS, MICROSCOPIC (REFLEX) - Abnormal; Notable for the following:    Bacteria, UA FEW (*)    Squamous Epithelial / LPF 6-30 (*)    All other  components within normal limits  PREGNANCY, URINE    EKG  EKG Interpretation None       Radiology No results found.  Procedures Procedures (including critical care time)  Medications Ordered in ED Medications - No data to display   Initial Impression / Assessment and Plan / ED Course  I have reviewed the triage vital signs and the nursing notes.  Pertinent labs & imaging results that were available during my care of the patient were reviewed by me and considered in my medical decision making (see chart for details).  Clinical Course     Patient presents here with complaints of suprapubic pain and cramping. She had some bleeding this morning. She reports being 3 weeks late for her period, however her pregnancy test is negative. I suspect this patient is starting her period and she is having cramping related to that. Her laboratory studies are unremarkable, urinalysis is otherwise clear, and ultrasound of the pelvis reveals no obvious abnormality. I see no indication for further workup. She will be discharged with a small quantity of tramadol and is to return as needed for any problems.  Final Clinical Impressions(s) / ED Diagnoses   Final diagnoses:  None    New Prescriptions New Prescriptions   No medications on file   I personally performed the services described in this documentation, which was scribed in my presence. The recorded information has been reviewed and is accurate.        Geoffery Lyonsouglas Charmine Bockrath, MD 09/02/16 [phone]

## 2016-09-02 NOTE — ED Triage Notes (Signed)
C/o abd/bak pain started last night-vaginal bleeding x today-LMP 11/5

## 2016-09-02 NOTE — ED Notes (Signed)
Pt endorses being sexually active with one mate without using condoms. Pt denies fever, or foul discharge. Pt states she is 20 days late on her period. Pt endorses cycle is very regular. Pt states he flow was abnormal this morning describing it as watery muccus and orange.

## 2016-09-02 NOTE — Discharge Instructions (Signed)
Tramadol as prescribed as needed for pain.  Return to the emergency department if you develop worsening pain, high fever, worsening bleeding, or other new and concerning symptoms.

## 2016-09-02 NOTE — ED Notes (Signed)
Patient transported to Ultrasound 

## 2016-09-02 NOTE — ED Notes (Signed)
Pt teaching provided on medications that may cause drowsiness. Pt instructed not to drive or operate heavy machinery while taking the prescribed medication. Pt verbalized understanding.   

## 2016-12-18 ENCOUNTER — Encounter (HOSPITAL_BASED_OUTPATIENT_CLINIC_OR_DEPARTMENT_OTHER): Payer: Self-pay | Admitting: Emergency Medicine

## 2016-12-18 ENCOUNTER — Emergency Department (HOSPITAL_BASED_OUTPATIENT_CLINIC_OR_DEPARTMENT_OTHER)
Admission: EM | Admit: 2016-12-18 | Discharge: 2016-12-18 | Disposition: A | Discharge status: Home / Self Care | Payer: Medicaid Other | Attending: Emergency Medicine | Admitting: Emergency Medicine

## 2016-12-18 DIAGNOSIS — F17210 Nicotine dependence, cigarettes, uncomplicated: Secondary | ICD-10-CM | POA: Insufficient documentation

## 2016-12-18 DIAGNOSIS — H8110 Benign paroxysmal vertigo, unspecified ear: Secondary | ICD-10-CM | POA: Diagnosis not present

## 2016-12-18 DIAGNOSIS — R42 Dizziness and giddiness: Secondary | ICD-10-CM | POA: Diagnosis present

## 2016-12-18 HISTORY — DX: Anxiety disorder, unspecified: F41.9

## 2016-12-18 MED ORDER — ONDANSETRON HCL 4 MG PO TABS: 4.0000 mg | ORAL_TABLET | Freq: Four times a day (QID) | ORAL | 0 refills | Status: DC

## 2016-12-18 MED ORDER — MECLIZINE HCL 25 MG PO TABS: 25.0000 mg | ORAL_TABLET | Freq: Three times a day (TID) | ORAL | 0 refills | Status: DC | PRN

## 2016-12-18 MED ORDER — MECLIZINE HCL 25 MG PO TABS
25.0000 mg | ORAL_TABLET | Freq: Once | ORAL | Status: AC
  Administered 2016-12-18: 25 mg via ORAL
  Filled 2016-12-18: qty 1

## 2016-12-18 NOTE — ED Triage Notes (Signed)
Since about 0900, has been feeling dizzy when bending over or when moves head. Nausea when moving head, feels like room is spinning.

## 2016-12-18 NOTE — ED Provider Notes (Signed)
[facility] Provider Note   CSN: [id number] Arrival date & time: 12/18/16  1701  By signing my name below, I, Doreatha Martin, attest that this documentation has been prepared under the direction and in the presence of Jerelyn Scott, MD. Electronically Signed: Doreatha Martin, ED Scribe. 12/18/16. 6:14 PM.     History   Chief Complaint Chief Complaint  Patient presents with  . Dizziness    HPI Alicia Brown is a 31 y.o. female otherwise healthy on no daily medications who presents to the Emergency Department complaining of constant "room spinning" dizziness that began this morning. She states her symptoms began when she stood up after eating breakfast. Pt reports associated photophobia and nausea. Per pt, her dizziness is exacerbated by bending forward, head movement and quick changes in position. No h/o of similar symptoms. Pt denies HA, vomiting, ear pain, hearing loss, congestion, tinnitus.   The history is provided by the patient. No language interpreter was used.  Dizziness  Quality:  Room spinning and head spinning Onset quality:  Sudden Duration:  1 day Timing:  Constant Progression:  Waxing and waning Chronicity:  New Context: bending over, head movement and standing up   Context: not with ear pain   Worsened by:  Movement, standing up and turning head Associated symptoms: nausea   Associated symptoms: no headaches, no hearing loss, no tinnitus and no vomiting   Risk factors: no heart disease, no hx of vertigo and no multiple medications     Past Medical History:  Diagnosis Date  . Anxiety   . Ovarian cyst     There are no active problems to display for this patient.   Past Surgical History:  Procedure Laterality Date  . APPENDECTOMY    . MYOMECTOMY    . TUBAL LIGATION      OB History    Gravida Para Term Preterm AB Living   SAB TAB Ectopic Multiple Live Births   1               Home Medications    Prior to Admission  medications   Medication Sig Start Date End Date Taking? Authorizing Provider  Citalopram Hydrobromide (CELEXA PO) Take by mouth.    Historical Provider, MD  meclizine (ANTIVERT) 25 MG tablet Take 1 tablet (25 mg total) by mouth 3 (three) times daily as needed for dizziness. 12/18/16   Jerelyn Scott, MD  ondansetron (ZOFRAN) 4 MG tablet Take 1 tablet (4 mg total) by mouth every 6 (six) hours. 12/18/16   Jerelyn Scott, MD  traMADol (ULTRAM) 50 MG tablet Take 1 tablet (50 mg total) by mouth every 6 (six) hours as needed. 09/02/16   Geoffery Lyons, MD  TRAZODONE HCL PO Take by mouth.    Historical Provider, MD    Family History No family history on file.  Social History Social History  Substance Use Topics  . Smoking status: Current Some Day Smoker    Packs/day: 0.25    Types: Cigarettes  . Smokeless tobacco: Never Used  . Alcohol use No     Allergies   Other   Review of Systems Review of Systems  HENT: Negative for congestion, ear pain, hearing loss and tinnitus.   Eyes: Positive for photophobia.  Gastrointestinal: Positive for nausea. Negative for vomiting.  Neurological: Positive for dizziness. Negative for headaches.  All other systems reviewed and are negative.    Physical Exam Updated Vital Signs BP 103/77 (BP  Location: Right Arm)   Pulse 72   Temp 98.6 F (37 C) (Oral)   Resp 14   Ht  (1.575 m)   Wt 118 lb (53.5 kg)   LMP 11/20/2016 (Exact Date)   SpO2 100%   BMI 21.58 kg/m  Vitals reviewed Physical Exam Physical Examination: General appearance - alert, well appearing, and in no distress Mental status - alert, oriented to person, place, and time Eyes - no conjunctival injection, no scleral icterus, no nystagmus Mouth - mucous membranes moist, pharynx normal without lesions Chest - clear to auscultation, no wheezes, rales or rhonchi, symmetric air entry Heart - normal rate, regular rhythm, normal S1, S2, no murmurs, rubs, clicks or gallops Neurological -  alert, oriented, normal speech, strength 5/5 in extremities x 4, sensation intact, pt has reproduction of vertigo symptoms with turning of head and with EOM testing Extremities - peripheral pulses normal, no pedal edema, no clubbing or cyanosis Skin - normal coloration and turgor, no rashes  ED Treatments / Results   DIAGNOSTIC STUDIES: Oxygen Saturation is 100% on RA, normal by my interpretation.    COORDINATION OF CARE: 6:13 PM Discussed treatment plan with pt at bedside which includes symptom management and pt agreed to plan.    Labs (all labs ordered are listed, but only abnormal results are displayed) Labs Reviewed - No data to display  EKG  EKG Interpretation  Date/Time:  Wednesday December 18 2016 17:17:53 EDT Ventricular Rate:  91 PR Interval:  166 QRS Duration: 76 QT Interval:  346 QTC Calculation: 425 R Axis:   112 Text Interpretation:  Normal sinus rhythm Left posterior fascicular block Abnormal ECG Since previous tracing rate has increased Confirmed by Karma Ganja  MD, MARTHA [phone]) on 12/18/2016 5:26:31 PM       Radiology No results found.  Procedures Procedures (including critical care time)  Medications Ordered in ED Medications  meclizine (ANTIVERT) tablet 25 mg (25 mg Oral Given 12/18/16 1822)     Initial Impression / Assessment and Plan / ED Course  I have reviewed the triage vital signs and the nursing notes.  Pertinent labs & imaging results that were available during my care of the patient were reviewed by me and considered in my medical decision making (see chart for details).     Pt presnting with vertigo symptoms that are positional in nature. Normal neuro exam in the ED with the exception of reproduction of symptoms that are reproduced with turning her head.  Pt treated with meclizine.  Discharged with strict return precautions.  Pt agreeable with plan.  Final Clinical Impressions(s) / ED Diagnoses   Final diagnoses:  Benign paroxysmal  positional vertigo, unspecified laterality    New Prescriptions Discharge Medication List as of 12/18/2016  7:31 PM    START taking these medications   Details  meclizine (ANTIVERT) 25 MG tablet Take 1 tablet (25 mg total) by mouth 3 (three) times daily as needed for dizziness., Starting Wed 12/18/2016, Print    ondansetron (ZOFRAN) 4 MG tablet Take 1 tablet (4 mg total) by mouth every 6 (six) hours., Starting Wed 12/18/2016, Print        I personally performed the services described in this documentation, which was scribed in my presence. The recorded information has been reviewed and is accurate.     Jerelyn Scott, MD 12/19/16 0130

## 2016-12-18 NOTE — Discharge Instructions (Signed)
Return to the ED with any concerns including changes in vision or speech, weakness in arms or legs, seizure activity, vomiting and not able to keep down liquids, decreased level of alertness/lethargy, or any other alarming symptoms

## 2017-03-21 ENCOUNTER — Encounter (HOSPITAL_BASED_OUTPATIENT_CLINIC_OR_DEPARTMENT_OTHER): Payer: Self-pay | Admitting: *Deleted

## 2017-03-21 ENCOUNTER — Emergency Department (HOSPITAL_BASED_OUTPATIENT_CLINIC_OR_DEPARTMENT_OTHER)
Admission: EM | Admit: 2017-03-21 | Discharge: 2017-03-21 | Disposition: A | Discharge status: Home / Self Care | Payer: Medicaid Other | Attending: Emergency Medicine | Admitting: Emergency Medicine

## 2017-03-21 DIAGNOSIS — F17210 Nicotine dependence, cigarettes, uncomplicated: Secondary | ICD-10-CM | POA: Insufficient documentation

## 2017-03-21 DIAGNOSIS — L0231 Cutaneous abscess of buttock: Secondary | ICD-10-CM | POA: Insufficient documentation

## 2017-03-21 DIAGNOSIS — Z79899 Other long term (current) drug therapy: Secondary | ICD-10-CM | POA: Insufficient documentation

## 2017-03-21 MED ORDER — LIDOCAINE-EPINEPHRINE (PF) 2 %-1:200000 IJ SOLN
10.0000 mL | Freq: Once | INTRAMUSCULAR | Status: AC
  Administered 2017-03-21: 10 mL via INTRADERMAL
  Filled 2017-03-21: qty 10

## 2017-03-21 NOTE — Discharge Instructions (Signed)
Keep wound clean and dry. Apply warm compresses to the area throughout the day. Take ibuprofen or Tylenol as needed for pain. Followup with [facility] in 2 days for wound recheck. Return to the ED sooner if she develop any concerning signs or symptoms such as fever, chills, worsening tenderness or redness, or abnormal drainage.

## 2017-03-21 NOTE — ED Triage Notes (Signed)
Rectal pain. She thinks she has an abscess.

## 2017-03-21 NOTE — ED Provider Notes (Signed)
[facility]   CSN: [id number] Arrival date & time: 03/21/17  1147     History   Chief Complaint Chief Complaint  Patient presents with  . Abscess    HPI Alicia Brown is a 31 y.o. female who presents today with chief complaint acute onset, intermittent perirectal pain since yesterday. Patient states that she noted tenderness while walking, and on wiping after a bowel movement. She denies pain with bowel movement, melena, hematochezia, dysuria, hematuria. She notes that she is on the second to last day of her menstrual cycle, but denies any abnormal bleeding, vaginal itching, vaginal discharge, or vaginal pain. She denies fevers or chills. Pain is elicited on walking, palpation, and sitting in certain positions. No drainage. She has not tried anything for her symptoms.  The history is provided by the patient.    Past Medical History:  Diagnosis Date  . Anxiety   . Ovarian cyst     There are no active problems to display for this patient.   Past Surgical History:  Procedure Laterality Date  . APPENDECTOMY    . MYOMECTOMY    . TUBAL LIGATION      OB History    Gravida Para Term Preterm AB Living   5 4 2 2 1 2    SAB TAB Ectopic Multiple Live Births   1               Home Medications    Prior to Admission medications   Medication Sig Start Date End Date Taking? Authorizing Provider  Citalopram Hydrobromide (CELEXA PO) Take by mouth.    [provider]  meclizine (ANTIVERT) 25 MG tablet Take 1 tablet (25 mg total) by mouth 3 (three) times daily as needed for dizziness. 12/18/16   Jerelyn ScottLinker, Martha, MD  ondansetron (ZOFRAN) 4 MG tablet Take 1 tablet (4 mg total) by mouth every 6 (six) hours. 12/18/16   Jerelyn ScottLinker, Martha, MD  traMADol (ULTRAM) 50 MG tablet Take 1 tablet (50 mg total) by mouth every 6 (six) hours as needed. 09/02/16   Geoffery Lyonselo, Douglas, MD  TRAZODONE HCL PO Take by mouth.    [provider]    Family History No  family history on file.  Social History Social History  Substance Use Topics  . Smoking status: Current Some Day Smoker    Packs/day: 0.25    Types: Cigarettes  . Smokeless tobacco: Never Used  . Alcohol use No     Allergies   Other   Review of Systems Review of Systems  Constitutional: Negative for chills and fever.  Gastrointestinal: Positive for rectal pain. Negative for abdominal pain, anal bleeding, blood in stool, diarrhea, nausea and vomiting.  Genitourinary: Negative for dysuria, hematuria, pelvic pain, vaginal discharge and vaginal pain.     Physical Exam Updated Vital Signs BP 104/65   Pulse 60   Temp 98.3 F (36.8 C) (Oral)   Resp 20   Ht 5\' 2"  (1.575 m)   Wt 54.4 kg (120 lb)   LMP 02/12/2017   SpO2 100%   BMI 21.95 kg/m   Physical Exam  Constitutional: She appears well-developed and well-nourished. No distress.  HENT:  Head: Normocephalic and atraumatic.  Eyes: Conjunctivae are normal. Right eye exhibits no discharge. Left eye exhibits no discharge.  Neck: No JVD present. No tracheal deviation present.  Cardiovascular: Normal rate.   Pulmonary/Chest: Effort normal.  Abdominal: Soft. Bowel sounds are normal. She exhibits no distension. There is no tenderness.  Genitourinary:  Genitourinary Comments: There is a 1cm area of fluctuance and tenderness along the right buttock just inferior to the rectum. No surrounding erythema or drainage. No frank rectal bleeding, fissures, tracts, or tears noted. No external hemorrhoids noted.  Musculoskeletal: She exhibits no edema.  Neurological: She is alert.  Skin: Skin is warm and dry. Capillary refill takes less than 2 seconds. No erythema.  Psychiatric: She has a normal mood and affect. Her behavior is normal.  Nursing note and vitals reviewed.    ED Treatments / Results  Labs (all labs ordered are listed, but only abnormal results are displayed) Labs Reviewed - No data to display  EKG  EKG  Interpretation None       Radiology No results found.  Procedures .Marland KitchenIncision and Drainage Date/Time: 03/21/2017 12:28 PM Performed by: Michela Pitcher A Authorized by: Michela Pitcher A   Consent:    Consent obtained:  Verbal   Consent given by:  Patient   Risks discussed:  Bleeding, incomplete drainage and pain Location:    Type:  Abscess   Size:  1   Location:  Anogenital   Anogenital location:  Perirectal Anesthesia (see MAR for exact dosages):    Anesthesia method:  Local infiltration   Local anesthetic:  Lidocaine 2% w/o epi Procedure type:    Complexity:  Simple Procedure details:    Incision types:  Stab incision   Incision depth:  Dermal   Scalpel blade:  11   Wound management:  Probed and deloculated, irrigated with saline and extensive cleaning   Drainage:  Purulent   Drainage amount:  Moderate   Wound treatment:  Wound left open Post-procedure details:    Patient tolerance of procedure:  Tolerated well, no immediate complications   (including critical care time)  Medications Ordered in ED Medications  lidocaine-EPINEPHrine (XYLOCAINE W/EPI) 2 %-1:200000 (PF) injection 10 mL (10 mLs Intradermal Given 03/21/17 1209)     Initial Impression / Assessment and Plan / ED Course  I have reviewed the triage vital signs and the nursing notes.  Pertinent labs & imaging results that were available during my care of the patient were reviewed by me and considered in my medical decision making (see chart for details).     Patient with skin abscess amenable to incision and drainage. Patient afebrile, vital signs are stable. Low suspicion of extensive perirectal abscess requiring surgical evaluation. Abscess was not large enough to warrant packing or drain,  wound recheck in 2 days. Encouraged home warm soaks and flushing. No antibiotic therapy is indicated. Discussed indications for return to the ED sooner. Pt verbalized understanding of and agreement with plan and is safe for  discharge home at this time.   Final Clinical Impressions(s) / ED Diagnoses   Final diagnoses:  Abscess of buttock, right    New Prescriptions New Prescriptions   No medications on file     Bennye Alm 03/21/17 1230    Vanetta Mulders, MD 03/22/17 [phone]

## 2017-08-12 ENCOUNTER — Encounter (HOSPITAL_BASED_OUTPATIENT_CLINIC_OR_DEPARTMENT_OTHER): Payer: Self-pay | Admitting: Emergency Medicine

## 2017-08-12 ENCOUNTER — Emergency Department (HOSPITAL_BASED_OUTPATIENT_CLINIC_OR_DEPARTMENT_OTHER)
Admission: EM | Admit: 2017-08-12 | Discharge: 2017-08-12 | Disposition: A | Discharge status: Home / Self Care | Payer: Medicaid Other | Attending: Emergency Medicine | Admitting: Emergency Medicine

## 2017-08-12 ENCOUNTER — Emergency Department (HOSPITAL_BASED_OUTPATIENT_CLINIC_OR_DEPARTMENT_OTHER): Payer: Medicaid Other

## 2017-08-12 ENCOUNTER — Other Ambulatory Visit: Payer: Self-pay

## 2017-08-12 DIAGNOSIS — F17210 Nicotine dependence, cigarettes, uncomplicated: Secondary | ICD-10-CM | POA: Diagnosis not present

## 2017-08-12 DIAGNOSIS — R112 Nausea with vomiting, unspecified: Secondary | ICD-10-CM | POA: Diagnosis present

## 2017-08-12 DIAGNOSIS — R1013 Epigastric pain: Secondary | ICD-10-CM

## 2017-08-12 DIAGNOSIS — Z79899 Other long term (current) drug therapy: Secondary | ICD-10-CM | POA: Insufficient documentation

## 2017-08-12 LAB — URINALYSIS, MICROSCOPIC (REFLEX): WBC, UA: NONE SEEN WBC/hpf (ref 0–5)

## 2017-08-12 LAB — CBC
HEMATOCRIT: 36.6 % (ref 36.0–46.0)
HEMOGLOBIN: 11.9 g/dL — AB (ref 12.0–15.0)
MCH: 31.2 pg (ref 26.0–34.0)
MCHC: 32.5 g/dL (ref 30.0–36.0)
MCV: 96.1 fL (ref 78.0–100.0)
Platelets: 331 10*3/uL (ref 150–400)
RBC: 3.81 MIL/uL — AB (ref 3.87–5.11)
RDW: 13.1 % (ref 11.5–15.5)
WBC: 7.9 10*3/uL (ref 4.0–10.5)

## 2017-08-12 LAB — URINALYSIS, ROUTINE W REFLEX MICROSCOPIC
BILIRUBIN URINE: NEGATIVE
Glucose, UA: NEGATIVE mg/dL
HGB URINE DIPSTICK: NEGATIVE
Ketones, ur: NEGATIVE mg/dL
NITRITE: NEGATIVE
PH: 7.5 (ref 5.0–8.0)
Protein, ur: NEGATIVE mg/dL
SPECIFIC GRAVITY, URINE: 1.015 (ref 1.005–1.030)

## 2017-08-12 LAB — COMPREHENSIVE METABOLIC PANEL
ALT: 13 U/L — ABNORMAL LOW (ref 14–54)
ANION GAP: 5 (ref 5–15)
AST: 21 U/L (ref 15–41)
Albumin: 3.7 g/dL (ref 3.5–5.0)
Alkaline Phosphatase: 78 U/L (ref 38–126)
BILIRUBIN TOTAL: 0.3 mg/dL (ref 0.3–1.2)
BUN: 5 mg/dL — ABNORMAL LOW (ref 6–20)
CHLORIDE: 109 mmol/L (ref 101–111)
CO2: 25 mmol/L (ref 22–32)
Calcium: 8.4 mg/dL — ABNORMAL LOW (ref 8.9–10.3)
Creatinine, Ser: 0.49 mg/dL (ref 0.44–1.00)
GFR calc Af Amer: >60 mL/min (ref >60)
Glucose, Bld: 111 mg/dL — ABNORMAL HIGH (ref 65–99)
POTASSIUM: 3.6 mmol/L (ref 3.5–5.1)
Sodium: 139 mmol/L (ref 135–145)
TOTAL PROTEIN: 6.3 g/dL — AB (ref 6.5–8.1)

## 2017-08-12 LAB — PREGNANCY, URINE: PREG TEST UR: NEGATIVE

## 2017-08-12 LAB — LIPASE, BLOOD: LIPASE: 20 U/L (ref 11–51)

## 2017-08-12 MED ORDER — PANTOPRAZOLE SODIUM 40 MG IV SOLR
40.0000 mg | Freq: Once | INTRAVENOUS | Status: AC
  Administered 2017-08-12: 40 mg via INTRAVENOUS
  Filled 2017-08-12: qty 40

## 2017-08-12 MED ORDER — ONDANSETRON HCL 4 MG/2ML IJ SOLN
4.0000 mg | Freq: Once | INTRAMUSCULAR | Status: AC | PRN
  Administered 2017-08-12: 4 mg via INTRAVENOUS
  Filled 2017-08-12: qty 2

## 2017-08-12 MED ORDER — IOPAMIDOL (ISOVUE-300) INJECTION 61%
100.0000 mL | Freq: Once | INTRAVENOUS | Status: AC | PRN
  Administered 2017-08-12: 100 mL via INTRAVENOUS

## 2017-08-12 MED ORDER — OMEPRAZOLE 20 MG PO CPDR: 20.0000 mg | DELAYED_RELEASE_CAPSULE | Freq: Every day | ORAL | 1 refills | Status: DC

## 2017-08-12 NOTE — ED Provider Notes (Signed)
[facility] EMERGENCY DEPARTMENT Provider Note   CSN: [id number] Arrival date & time: 08/12/17  1809     History   Chief Complaint Chief Complaint  Patient presents with  . Emesis    HPI Alicia Brown is a 31 y.o. female.  HPI Reports that she has had a very full and uncomfortable sensation in her mid and central abdomen.  She reports that when she eats it feels like she gets very full and uncomfortable and nauseated.  Most like there is a ball suspended in her abdomen.  It is gotten worse over the past 2 days and now she is got very nauseated and vomited.  No fever.  No diarrhea or constipation.  Is exacerbated by certain foods.  No vaginal discharge or bleeding. Past Medical History:  Diagnosis Date  . Anxiety   . Ovarian cyst     There are no active problems to display for this patient.   Past Surgical History:  Procedure Laterality Date  . APPENDECTOMY    . MYOMECTOMY    . TUBAL LIGATION      OB History    Gravida Para Term Preterm AB Living   5 4 2 2 1 2    SAB TAB Ectopic Multiple Live Births   1               Home Medications    Prior to Admission medications   Medication Sig Start Date End Date Taking? Authorizing Provider  Citalopram Hydrobromide (CELEXA PO) Take by mouth.    [provider]  meclizine (ANTIVERT) 25 MG tablet Take 1 tablet (25 mg total) by mouth 3 (three) times daily as needed for dizziness. 12/18/16   Mabe, Latanya MaudlinMartha L, MD  omeprazole (PRILOSEC) 20 MG capsule Take 1 capsule (20 mg total) by mouth daily. 08/12/17   Arby BarrettePfeiffer, Barbarann Kelly, MD  ondansetron (ZOFRAN) 4 MG tablet Take 1 tablet (4 mg total) by mouth every 6 (six) hours. 12/18/16   Mabe, Latanya MaudlinMartha L, MD  traMADol (ULTRAM) 50 MG tablet Take 1 tablet (50 mg total) by mouth every 6 (six) hours as needed. 09/02/16   Geoffery Lyonselo, Douglas, MD  TRAZODONE HCL PO Take by mouth.    [provider]    Family History History reviewed. No pertinent family history.  Social  History Social History   Tobacco Use  . Smoking status: Current Some Day Smoker    Packs/day: 0.25    Types: Cigarettes  . Smokeless tobacco: Never Used  Substance Use Topics  . Alcohol use: No  . Drug use: No     Allergies   Other   Review of Systems Review of Systems 10 Systems reviewed and are negative for acute change except as noted in the HPI.   Physical Exam Updated Vital Signs BP 115/73 (BP Location: Left Arm)   Pulse 62   Temp 98.2 F (36.8 C) (Oral)   Resp 16   Ht 5\' 2"  (1.575 m)   Wt 54.4 kg (120 lb)   LMP 06/12/2017   SpO2 96%   BMI 21.95 kg/m   Physical Exam  Constitutional: She is oriented to person, place, and time. She appears well-developed and well-nourished. No distress.  HENT:  Head: Normocephalic and atraumatic.  Nose: Nose normal.  Mouth/Throat: Oropharynx is clear and moist.  Eyes: Conjunctivae and EOM are normal.  Neck: Neck supple.  Cardiovascular: Normal rate, regular rhythm, normal heart sounds and intact distal pulses.  No murmur heard. Pulmonary/Chest: Effort normal and  breath sounds normal. No respiratory distress.  Abdominal: Soft. She exhibits distension. There is tenderness.  Epigastrium and central abdomen very tender to palpation.  Diffuse abdominal distention.  Musculoskeletal: Normal range of motion. She exhibits no edema or tenderness.  Neurological: She is alert and oriented to person, place, and time. No cranial nerve deficit. She exhibits normal muscle tone. Coordination normal.  Skin: Skin is warm and dry.  Psychiatric: She has a normal mood and affect.  Nursing note and vitals reviewed.    ED Treatments / Results  Labs (all labs ordered are listed, but only abnormal results are displayed) Labs Reviewed  URINALYSIS, ROUTINE W REFLEX MICROSCOPIC - Abnormal; Notable for the following components:      Result Value   Leukocytes, UA SMALL (*)    All other components within normal limits  URINALYSIS, MICROSCOPIC  (REFLEX) - Abnormal; Notable for the following components:   Bacteria, UA RARE (*)    Squamous Epithelial / LPF 0-5 (*)    All other components within normal limits  COMPREHENSIVE METABOLIC PANEL - Abnormal; Notable for the following components:   Glucose, Bld 111 (*)    BUN 5 (*)    Calcium 8.4 (*)    Total Protein 6.3 (*)    ALT 13 (*)    All other components within normal limits  CBC - Abnormal; Notable for the following components:   RBC 3.81 (*)    Hemoglobin 11.9 (*)    All other components within normal limits  PREGNANCY, URINE  LIPASE, BLOOD    EKG  EKG Interpretation None       Radiology Ct Abdomen Pelvis W Contrast  Result Date: 08/12/2017 CLINICAL DATA:  31 year old female with abdominal distention. EXAM: CT ABDOMEN AND PELVIS WITH CONTRAST TECHNIQUE: Multidetector CT imaging of the abdomen and pelvis was performed using the standard protocol following bolus administration of intravenous contrast. CONTRAST:  ISOVUE-300 IOPAMIDOL (ISOVUE-300) INJECTION 61% COMPARISON:  Ultrasound dated 09/02/2016 and CT dated 03/11/2014 FINDINGS: Lower chest: Minimal bibasilar dependent atelectatic changes of the lungs. The visualized lung bases are otherwise clear. No intra-abdominal free air.  Small free fluid within the pelvis. Hepatobiliary: No focal liver abnormality is seen. No gallstones, gallbladder wall thickening, or biliary dilatation. Pancreas: Unremarkable. No pancreatic ductal dilatation or surrounding inflammatory changes. Spleen: Normal in size without focal abnormality. Adrenals/Urinary Tract: Adrenal glands are unremarkable. Kidneys are normal, without renal calculi, focal lesion, or hydronephrosis. Bladder is unremarkable. Stomach/Bowel: There is no bowel obstruction or active inflammation. Appendectomy. Vascular/Lymphatic: No significant vascular findings are present. No enlarged abdominal or pelvic lymph nodes. Reproductive: The uterus is retroverted and grossly  unremarkable. Bilateral tubal ligation clips noted. The ovaries are grossly unremarkable as visualized. Other: None Musculoskeletal: No acute or significant osseous findings. IMPRESSION: No acute intra-abdominal or pelvic pathology. No bowel obstruction or active inflammation. Electronically Signed   By: Elgie Collard M.D.   On: 08/12/2017 22:46    Procedures Procedures (including critical care time)  Medications Ordered in ED Medications  ondansetron (ZOFRAN) injection 4 mg (4 mg Intravenous Given 08/12/17 1936)  pantoprazole (PROTONIX) injection 40 mg (40 mg Intravenous Given 08/12/17 2040)  iopamidol (ISOVUE-300) 61 % injection 100 mL (100 mLs Intravenous Contrast Given 08/12/17 2208)     Initial Impression / Assessment and Plan / ED Course  I have reviewed the triage vital signs and the nursing notes.  Pertinent labs & imaging results that were available during my care of the patient were reviewed by me  and considered in my medical decision making (see chart for details).      Final Clinical Impressions(s) / ED Diagnoses   Final diagnoses:  Epigastric pain  Dyspepsia  CT negative for acute findings.  No UTI.  This time symptoms are most suggestive of GERD or peptic ulcer disease.  Will initiate the patient on daily omeprazole.  Food instructions are provided.  At this time exam is nonsurgical.  Return precautions are reviewed.  Patient is made aware of the possible need for upper endoscopy if symptoms are persisting or worsening.  Patient has follow-up with primary care in 2 days.  ED Discharge Orders        Ordered    omeprazole (PRILOSEC) 20 MG capsule  Daily     08/12/17 2304       Arby BarrettePfeiffer, Samarth Ogle, MD 08/12/17 2314

## 2017-08-12 NOTE — ED Triage Notes (Addendum)
patient states that she has felt nauseated for the last 2 -3 days  - patient states that she is having pressure in her abdominal area and when she first starts to void

## 2017-08-12 NOTE — ED Notes (Signed)
Patient transported to CT 

## 2017-11-25 ENCOUNTER — Encounter (HOSPITAL_BASED_OUTPATIENT_CLINIC_OR_DEPARTMENT_OTHER): Payer: Self-pay

## 2017-11-25 ENCOUNTER — Emergency Department (HOSPITAL_BASED_OUTPATIENT_CLINIC_OR_DEPARTMENT_OTHER)
Admission: EM | Admit: 2017-11-25 | Discharge: 2017-11-25 | Disposition: A | Discharge status: Home / Self Care | Payer: Medicaid Other | Attending: Emergency Medicine | Admitting: Emergency Medicine

## 2017-11-25 ENCOUNTER — Emergency Department (HOSPITAL_BASED_OUTPATIENT_CLINIC_OR_DEPARTMENT_OTHER): Payer: Medicaid Other

## 2017-11-25 ENCOUNTER — Other Ambulatory Visit: Payer: Self-pay

## 2017-11-25 DIAGNOSIS — N3000 Acute cystitis without hematuria: Secondary | ICD-10-CM | POA: Insufficient documentation

## 2017-11-25 DIAGNOSIS — F419 Anxiety disorder, unspecified: Secondary | ICD-10-CM | POA: Insufficient documentation

## 2017-11-25 DIAGNOSIS — R51 Headache: Secondary | ICD-10-CM | POA: Insufficient documentation

## 2017-11-25 DIAGNOSIS — F17210 Nicotine dependence, cigarettes, uncomplicated: Secondary | ICD-10-CM | POA: Diagnosis not present

## 2017-11-25 DIAGNOSIS — R519 Headache, unspecified: Secondary | ICD-10-CM

## 2017-11-25 LAB — LIPASE, BLOOD: LIPASE: 24 U/L (ref 11–51)

## 2017-11-25 LAB — URINALYSIS, ROUTINE W REFLEX MICROSCOPIC
Bilirubin Urine: NEGATIVE
GLUCOSE, UA: NEGATIVE mg/dL
Hgb urine dipstick: NEGATIVE
Ketones, ur: NEGATIVE mg/dL
Nitrite: NEGATIVE
PROTEIN: NEGATIVE mg/dL
Specific Gravity, Urine: 1.025 (ref 1.005–1.030)
pH: 8 (ref 5.0–8.0)

## 2017-11-25 LAB — CBC WITH DIFFERENTIAL/PLATELET
BASOS ABS: 0 10*3/uL (ref 0.0–0.1)
Basophils Relative: 0 %
Eosinophils Absolute: 0.1 10*3/uL (ref 0.0–0.7)
Eosinophils Relative: 1 %
HEMATOCRIT: 37.4 % (ref 36.0–46.0)
HEMOGLOBIN: 12.2 g/dL (ref 12.0–15.0)
LYMPHS PCT: 22 %
Lymphs Abs: 1.7 10*3/uL (ref 0.7–4.0)
MCH: 30.9 pg (ref 26.0–34.0)
MCHC: 32.6 g/dL (ref 30.0–36.0)
MCV: 94.7 fL (ref 78.0–100.0)
MONO ABS: 0.6 10*3/uL (ref 0.1–1.0)
Monocytes Relative: 8 %
NEUTROS ABS: 5.3 10*3/uL (ref 1.7–7.7)
NEUTROS PCT: 69 %
Platelets: 322 10*3/uL (ref 150–400)
RBC: 3.95 MIL/uL (ref 3.87–5.11)
RDW: 12.5 % (ref 11.5–15.5)
WBC: 7.8 10*3/uL (ref 4.0–10.5)

## 2017-11-25 LAB — COMPREHENSIVE METABOLIC PANEL
ALK PHOS: 69 U/L (ref 38–126)
ALT: 14 U/L (ref 14–54)
AST: 21 U/L (ref 15–41)
Albumin: 3.7 g/dL (ref 3.5–5.0)
Anion gap: 6 (ref 5–15)
BILIRUBIN TOTAL: 0.3 mg/dL (ref 0.3–1.2)
BUN: 8 mg/dL (ref 6–20)
CALCIUM: 8.4 mg/dL — AB (ref 8.9–10.3)
CO2: 23 mmol/L (ref 22–32)
CREATININE: 0.63 mg/dL (ref 0.44–1.00)
Chloride: 112 mmol/L — ABNORMAL HIGH (ref 101–111)
GFR calc Af Amer: >60 mL/min (ref >60)
GLUCOSE: 130 mg/dL — AB (ref 65–99)
POTASSIUM: 3.3 mmol/L — AB (ref 3.5–5.1)
Sodium: 141 mmol/L (ref 135–145)
TOTAL PROTEIN: 6.3 g/dL — AB (ref 6.5–8.1)

## 2017-11-25 LAB — PREGNANCY, URINE: PREG TEST UR: NEGATIVE

## 2017-11-25 LAB — URINALYSIS, MICROSCOPIC (REFLEX)

## 2017-11-25 MED ORDER — LORAZEPAM 1 MG PO TABS: 1.0000 mg | ORAL_TABLET | Freq: Three times a day (TID) | ORAL | 0 refills | Status: DC | PRN

## 2017-11-25 MED ORDER — CEPHALEXIN 250 MG PO CAPS
500.0000 mg | ORAL_CAPSULE | Freq: Once | ORAL | Status: AC
  Administered 2017-11-25: 500 mg via ORAL
  Filled 2017-11-25: qty 2

## 2017-11-25 MED ORDER — PROCHLORPERAZINE EDISYLATE 5 MG/ML IJ SOLN
10.0000 mg | Freq: Once | INTRAMUSCULAR | Status: AC
  Administered 2017-11-25: 10 mg via INTRAVENOUS
  Filled 2017-11-25: qty 2

## 2017-11-25 MED ORDER — DIPHENHYDRAMINE HCL 50 MG/ML IJ SOLN
25.0000 mg | Freq: Once | INTRAMUSCULAR | Status: AC
  Administered 2017-11-25: 25 mg via INTRAVENOUS
  Filled 2017-11-25: qty 1

## 2017-11-25 MED ORDER — DEXAMETHASONE SODIUM PHOSPHATE 10 MG/ML IJ SOLN
10.0000 mg | Freq: Once | INTRAMUSCULAR | Status: AC
  Administered 2017-11-25: 10 mg via INTRAVENOUS
  Filled 2017-11-25: qty 1

## 2017-11-25 MED ORDER — SODIUM CHLORIDE 0.9 % IV BOLUS (SEPSIS)
1000.0000 mL | Freq: Once | INTRAVENOUS | Status: AC
  Administered 2017-11-25: 1000 mL via INTRAVENOUS

## 2017-11-25 MED ORDER — CEPHALEXIN 500 MG PO CAPS: 500.0000 mg | ORAL_CAPSULE | Freq: Two times a day (BID) | ORAL | 0 refills | Status: AC

## 2017-11-25 NOTE — Discharge Instructions (Signed)
Please take the antibiotics to treat the urinary tract infection we discovered during her workup.  We suspect you have a headache that is probably related to stress, anxiety, and some dehydration.  Please stay hydrated.  Please use the Ativan to help with your anxiety and nausea.  Please follow-up with your primary doctor in the next several days for reassessment and further management.  If any symptoms change or worsen, please return to the nearest emergency department.

## 2017-11-25 NOTE — ED Notes (Signed)
ED Provider at bedside. 
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
## 2017-11-25 NOTE — ED Triage Notes (Signed)
C/o HA, n/v x 2 days-NAD-slow gait

## 2017-11-27 LAB — URINE CULTURE: CULTURE: NO GROWTH

## 2018-02-05 ENCOUNTER — Other Ambulatory Visit: Payer: Self-pay

## 2018-02-05 ENCOUNTER — Emergency Department (HOSPITAL_BASED_OUTPATIENT_CLINIC_OR_DEPARTMENT_OTHER)
Admission: EM | Admit: 2018-02-05 | Discharge: 2018-02-06 | Disposition: A | Discharge status: Home / Self Care | Payer: Medicaid Other | Attending: Emergency Medicine | Admitting: Emergency Medicine

## 2018-02-05 ENCOUNTER — Encounter (HOSPITAL_BASED_OUTPATIENT_CLINIC_OR_DEPARTMENT_OTHER): Payer: Self-pay

## 2018-02-05 DIAGNOSIS — R110 Nausea: Secondary | ICD-10-CM | POA: Diagnosis not present

## 2018-02-05 DIAGNOSIS — H5500 Unspecified nystagmus: Secondary | ICD-10-CM | POA: Diagnosis not present

## 2018-02-05 DIAGNOSIS — R42 Dizziness and giddiness: Secondary | ICD-10-CM | POA: Diagnosis not present

## 2018-02-05 DIAGNOSIS — F17210 Nicotine dependence, cigarettes, uncomplicated: Secondary | ICD-10-CM | POA: Diagnosis not present

## 2018-02-05 DIAGNOSIS — G43809 Other migraine, not intractable, without status migrainosus: Secondary | ICD-10-CM | POA: Insufficient documentation

## 2018-02-05 HISTORY — DX: Dizziness and giddiness: R42

## 2018-02-05 LAB — COMPREHENSIVE METABOLIC PANEL
ALK PHOS: 88 U/L (ref 38–126)
ALT: 14 U/L (ref 14–54)
ANION GAP: 10 (ref 5–15)
AST: 19 U/L (ref 15–41)
Albumin: 4.7 g/dL (ref 3.5–5.0)
BUN: 10 mg/dL (ref 6–20)
CALCIUM: 9 mg/dL (ref 8.9–10.3)
CO2: 22 mmol/L (ref 22–32)
Chloride: 105 mmol/L (ref 101–111)
Creatinine, Ser: 0.77 mg/dL (ref 0.44–1.00)
GLUCOSE: 91 mg/dL (ref 65–99)
POTASSIUM: 3.4 mmol/L — AB (ref 3.5–5.1)
Sodium: 137 mmol/L (ref 135–145)
TOTAL PROTEIN: 7.6 g/dL (ref 6.5–8.1)
Total Bilirubin: 0.4 mg/dL (ref 0.3–1.2)

## 2018-02-05 LAB — URINALYSIS, ROUTINE W REFLEX MICROSCOPIC
GLUCOSE, UA: NEGATIVE mg/dL
HGB URINE DIPSTICK: NEGATIVE
KETONES UR: 15 mg/dL — AB
NITRITE: NEGATIVE
Protein, ur: 30 mg/dL — AB
pH: 5.5 (ref 5.0–8.0)

## 2018-02-05 LAB — CBC WITH DIFFERENTIAL/PLATELET
BASOS ABS: 0 10*3/uL (ref 0.0–0.1)
Basophils Relative: 0 %
EOS PCT: 2 %
Eosinophils Absolute: 0.2 10*3/uL (ref 0.0–0.7)
HEMATOCRIT: 42.7 % (ref 36.0–46.0)
Hemoglobin: 14.7 g/dL (ref 12.0–15.0)
LYMPHS PCT: 32 %
Lymphs Abs: 2.8 10*3/uL (ref 0.7–4.0)
MCH: 32 pg (ref 26.0–34.0)
MCHC: 34.4 g/dL (ref 30.0–36.0)
MCV: 93 fL (ref 78.0–100.0)
MONO ABS: 0.8 10*3/uL (ref 0.1–1.0)
MONOS PCT: 9 %
NEUTROS ABS: 4.8 10*3/uL (ref 1.7–7.7)
Neutrophils Relative %: 57 %
PLATELETS: 363 10*3/uL (ref 150–400)
RBC: 4.59 MIL/uL (ref 3.87–5.11)
RDW: 12.4 % (ref 11.5–15.5)
WBC: 8.6 10*3/uL (ref 4.0–10.5)

## 2018-02-05 LAB — LIPASE, BLOOD: LIPASE: 26 U/L (ref 11–51)

## 2018-02-05 LAB — URINALYSIS, MICROSCOPIC (REFLEX)

## 2018-02-05 LAB — PREGNANCY, URINE: Preg Test, Ur: NEGATIVE

## 2018-02-05 MED ORDER — KETOROLAC TROMETHAMINE 30 MG/ML IJ SOLN
30.0000 mg | Freq: Once | INTRAMUSCULAR | Status: AC
  Administered 2018-02-05: 30 mg via INTRAVENOUS
  Filled 2018-02-05: qty 1

## 2018-02-05 MED ORDER — METOCLOPRAMIDE HCL 5 MG/ML IJ SOLN
10.0000 mg | Freq: Once | INTRAMUSCULAR | Status: AC
  Administered 2018-02-05: 10 mg via INTRAVENOUS
  Filled 2018-02-05: qty 2

## 2018-02-05 MED ORDER — MECLIZINE HCL 25 MG PO TABS
25.0000 mg | ORAL_TABLET | Freq: Once | ORAL | Status: AC
  Administered 2018-02-05: 25 mg via ORAL
  Filled 2018-02-05: qty 1

## 2018-02-05 MED ORDER — SODIUM CHLORIDE 0.9 % IV BOLUS
1000.0000 mL | Freq: Once | INTRAVENOUS | Status: AC
  Administered 2018-02-05: 1000 mL via INTRAVENOUS

## 2018-02-05 NOTE — ED Notes (Signed)
Patient up to bathroom for urine sample

## 2018-02-05 NOTE — ED Triage Notes (Signed)
Less than an hour ago, pt started to have sudden dizziness and n/v, states it feels the same as when she had vertigo last year

## 2018-02-05 NOTE — ED Provider Notes (Signed)
Emergency Department Provider Note   I have reviewed the triage vital signs and the nursing notes.   HISTORY  Chief Complaint Dizziness   HPI Alicia Brown is a 32 y.o. female with PMH of vertigo and migraine presents to the ED with with acute onset nausea, vomiting, vertigo.  The patient has had similar symptoms approximately 1 year ago.  She has some associated right ear fullness and right-sided headache.  She feels generally weak all over and is experiencing photophobia.  She sees double vision and feels motion with any movement. Symptoms improved with sitting still.    Past Medical History:  Diagnosis Date  . Anxiety   . Ovarian cyst   . Vertigo     There are no active problems to display for this patient.   Past Surgical History:  Procedure Laterality Date  . APPENDECTOMY    . MYOMECTOMY    . TUBAL LIGATION      Current Outpatient Rx  . Order #: [id number] Class: Print  . Order #: [id number] Class: Print  . Order #: [id number] Class: Print  . Order #: [id number] Class: Print    Allergies Other  No family history on file.  Social History Social History   Tobacco Use  . Smoking status: Current Every Day Smoker    Packs/day: 0.25    Types: Cigarettes  . Smokeless tobacco: Never Used  Substance Use Topics  . Alcohol use: No  . Drug use: No    Review of Systems  Constitutional: No fever/chills. Positive generalized weakness.  Eyes: Positive double vision.  ENT: No sore throat. Positive vertigo worse with head movement.  Cardiovascular: Denies chest pain. Respiratory: Denies shortness of breath. Gastrointestinal: No abdominal pain. Positive nausea, no vomiting.  No diarrhea.  No constipation. Genitourinary: Negative for dysuria. Musculoskeletal: Negative for back pain. Skin: Negative for rash. Neurological: Negative for headaches, focal weakness or numbness.  10-point ROS otherwise  negative.  ____________________________________________   PHYSICAL EXAM:  VITAL SIGNS: ED Triage Vitals  Enc Vitals Group     BP 02/05/18 2143 96/72     Pulse Rate 02/05/18 2143 79     Resp 02/05/18 2143 18     Temp 02/05/18 2143 98.3 F (36.8 C)     Temp Source 02/05/18 2143 Oral     SpO2 02/05/18 2143 100 %     Weight 02/05/18 2144 120 lb (54.4 kg)     Height 02/05/18 2144  (1.575 m)     Pain Score 02/05/18 2144 7    Constitutional: Alert and oriented. Covering face from light and appears uncomfortable. Symptoms worse with any movement.  Eyes: Conjunctivae are normal. PERRL. EOMI. Head: Atraumatic. Nose: No congestion/rhinnorhea. Normal TMs bilaterally. Normal external ear canals. No mastoid tenderness.  Mouth/Throat: Mucous membranes are moist.  Neck: No stridor.  No meningeal signs.  Cardiovascular: Normal rate, regular rhythm. Good peripheral circulation. Grossly normal heart sounds.   Respiratory: Normal respiratory effort.  No retractions. Lungs CTAB. Gastrointestinal: Soft and nontender. No distention.  Musculoskeletal: No lower extremity tenderness nor edema. No gross deformities of extremities. Neurologic:  Normal speech and language. No gross focal neurologic deficits are appreciated. CN exam 2-12 intact. Normal finger-to-nose testing. Normal heel-to-shin.  Skin:  Skin is warm, dry and intact. No rash noted.  ____________________________________________   LABS (all labs ordered are listed, but only abnormal results are displayed)  Labs Reviewed  URINALYSIS, ROUTINE W REFLEX MICROSCOPIC - Abnormal; Notable for the following components:  Result Value   Color, Urine AMBER (*)    APPearance CLOUDY (*)    Specific Gravity, Urine >1.030 (*)    Bilirubin Urine SMALL (*)    Ketones, ur 15 (*)    Protein, ur 30 (*)    Leukocytes, UA TRACE (*)    All other components within normal limits  COMPREHENSIVE METABOLIC PANEL - Abnormal; Notable for the following  components:   Potassium 3.4 (*)    All other components within normal limits  URINALYSIS, MICROSCOPIC (REFLEX) - Abnormal; Notable for the following components:   Bacteria, UA MANY (*)    All other components within normal limits  CBC WITH DIFFERENTIAL/PLATELET  PREGNANCY, URINE  LIPASE, BLOOD   ____________________________________________   PROCEDURES  Procedure(s) performed:   Procedures  None ____________________________________________   INITIAL IMPRESSION / ASSESSMENT AND PLAN / ED COURSE  Pertinent labs & imaging results that were available during my care of the patient were reviewed by me and considered in my medical decision making (see chart for details).  Patient presents to the emergency department for evaluation of acute onset vertigo symptoms with nausea and vomiting.  She has an associated right-sided headache.  She has a history of both migraine headaches and vertigo.  The patient's neurological exam is significant only for some mild nystagmus.  She has normal finger-to-nose and heel-to-shin testing.  Deferred gait testing with significant vertigo symptoms.  Symptoms are worse with movement.  Suspect peripheral etiology with coinciding migraine headache.  No sudden onset, maximal intensity symptoms to suggest subarachnoid hemorrhage.  No evidence to suggest infectious etiology.  Plan for baseline labs, symptom management, reassess.   11:30 PM Patient is feeling much better. She not longer has HA and vertigo symptoms have resolved. Patient is sleepy but will call mother for a ride home. Provided contact info for outpatient neurology given multiple ED presentations for HA.   At this time, I do not feel there is any life-threatening condition present. I have reviewed and discussed all results (EKG, imaging, lab, urine as appropriate), exam findings with patient. I have reviewed nursing notes and appropriate previous records.  I feel the patient is safe to be discharged  home without further emergent workup. Discussed usual and customary return precautions. Patient and family (if present) verbalize understanding and are comfortable with this plan.  Patient will follow-up with their primary care provider. If they do not have a primary care provider, information for follow-up has been provided to them. All questions have been answered.  ____________________________________________  FINAL CLINICAL IMPRESSION(S) / ED DIAGNOSES  Final diagnoses:  Vertigo  Other migraine without status migrainosus, not intractable    MEDICATIONS GIVEN DURING THIS VISIT:  Medications  sodium chloride 0.9 % bolus 1,000 mL (0 mLs Intravenous Stopped 02/06/18 0021)  meclizine (ANTIVERT) tablet 25 mg (25 mg Oral Given 02/05/18 2217)  metoCLOPramide (REGLAN) injection 10 mg (10 mg Intravenous Given 02/05/18 2241)  ketorolac (TORADOL) 30 MG/ML injection 30 mg (30 mg Intravenous Given 02/05/18 2247)     NEW OUTPATIENT MEDICATIONS STARTED DURING THIS VISIT:  Discharge Medication List as of 02/06/2018 12:11 AM    START taking these medications   Details  ibuprofen (ADVIL,MOTRIN) 800 MG tablet Take 1 tablet (800 mg total) by mouth every 8 (eight) hours as needed., Starting Fri 02/06/2018, Print    meclizine (ANTIVERT) 25 MG tablet Take 1 tablet (25 mg total) by mouth 3 (three) times daily as needed for dizziness., Starting Fri 02/06/2018, Print    ondansetron (  ZOFRAN ODT) 4 MG disintegrating tablet Take 1 tablet (4 mg total) by mouth every 8 (eight) hours as needed for nausea or vomiting., Starting Fri 02/06/2018, Print        Note:  This document was prepared using Dragon voice recognition software and may include unintentional dictation errors.  Alona Bene, MD Emergency Medicine    Arvella Massingale, Arlyss Repress, MD 02/06/18 [phone]

## 2018-02-06 MED ORDER — ONDANSETRON 4 MG PO TBDP: 4.0000 mg | ORAL_TABLET | Freq: Three times a day (TID) | ORAL | 0 refills | Status: DC | PRN

## 2018-02-06 MED ORDER — IBUPROFEN 800 MG PO TABS: 800.0000 mg | ORAL_TABLET | Freq: Three times a day (TID) | ORAL | 0 refills | Status: DC | PRN

## 2018-02-06 MED ORDER — MECLIZINE HCL 25 MG PO TABS: 25.0000 mg | ORAL_TABLET | Freq: Three times a day (TID) | ORAL | 0 refills | Status: DC | PRN

## 2018-02-06 NOTE — Discharge Instructions (Signed)
We believe your symptoms were caused by benign vertigo.  Please read through the included information and take any prescribed medication(s).  Follow up with your doctor as listed above.  If you develop any new or worsening symptoms that concern you, including but not limited to persistent dizziness/vertigo, numbness or weakness in your arms or legs, altered mental status, persistent vomiting, or fever greater than 101, please return immediately to the Emergency Department.  

## 2018-04-02 ENCOUNTER — Encounter (HOSPITAL_BASED_OUTPATIENT_CLINIC_OR_DEPARTMENT_OTHER): Payer: Self-pay | Admitting: Emergency Medicine

## 2018-04-02 ENCOUNTER — Emergency Department (HOSPITAL_BASED_OUTPATIENT_CLINIC_OR_DEPARTMENT_OTHER): Payer: Medicaid Other

## 2018-04-02 ENCOUNTER — Other Ambulatory Visit: Payer: Self-pay

## 2018-04-02 DIAGNOSIS — Y929 Unspecified place or not applicable: Secondary | ICD-10-CM | POA: Diagnosis not present

## 2018-04-02 DIAGNOSIS — W01198A Fall on same level from slipping, tripping and stumbling with subsequent striking against other object, initial encounter: Secondary | ICD-10-CM | POA: Insufficient documentation

## 2018-04-02 DIAGNOSIS — Y939 Activity, unspecified: Secondary | ICD-10-CM | POA: Diagnosis not present

## 2018-04-02 DIAGNOSIS — R200 Anesthesia of skin: Secondary | ICD-10-CM | POA: Diagnosis present

## 2018-04-02 DIAGNOSIS — S0990XA Unspecified injury of head, initial encounter: Secondary | ICD-10-CM | POA: Diagnosis not present

## 2018-04-02 DIAGNOSIS — Y998 Other external cause status: Secondary | ICD-10-CM | POA: Diagnosis not present

## 2018-04-02 DIAGNOSIS — F17210 Nicotine dependence, cigarettes, uncomplicated: Secondary | ICD-10-CM | POA: Diagnosis not present

## 2018-04-02 NOTE — ED Triage Notes (Signed)
Pt states she has vertigo issues  Pt states yesterday she fell yesterday and struck her head on the corner of the wall  Pt states since the fall she has not been able to feel her fingers on both hands  Pt can move both hands within normal limits

## 2018-04-03 ENCOUNTER — Emergency Department (HOSPITAL_BASED_OUTPATIENT_CLINIC_OR_DEPARTMENT_OTHER)
Admission: EM | Admit: 2018-04-03 | Discharge: 2018-04-03 | Disposition: A | Discharge status: Home / Self Care | Payer: Medicaid Other | Attending: Emergency Medicine | Admitting: Emergency Medicine

## 2018-04-03 DIAGNOSIS — R202 Paresthesia of skin: Secondary | ICD-10-CM

## 2018-04-03 MED ORDER — ACETAMINOPHEN 500 MG PO TABS
1000.0000 mg | ORAL_TABLET | Freq: Once | ORAL | Status: AC
  Administered 2018-04-03: 1000 mg via ORAL
  Filled 2018-04-03: qty 2

## 2018-04-03 MED ORDER — NAPROXEN 250 MG PO TABS
500.0000 mg | ORAL_TABLET | Freq: Once | ORAL | Status: AC
  Administered 2018-04-03: 500 mg via ORAL
  Filled 2018-04-03: qty 2

## 2018-04-03 NOTE — ED Provider Notes (Addendum)
[facility] EMERGENCY DEPARTMENT Provider Note   CSN: [id number] Arrival date & time: 04/02/18  2253     History   Chief Complaint Chief Complaint  Patient presents with  . Fall    HPI Alicia Brown is a 32 y.o. female.  The history is provided by the patient.  Fall  This is a new problem. The current episode started 2 days ago. The problem occurs constantly. The problem has not changed since onset.Pertinent negatives include no chest pain, no abdominal pain, no headaches and no shortness of breath. Nothing aggravates the symptoms. Nothing relieves the symptoms. She has tried nothing for the symptoms. The treatment provided no relief.  Dizziness  Quality:  Head spinning Severity:  Severe Onset quality:  Sudden Timing:  Constant Progression:  Resolved Chronicity:  Recurrent Context: not when bending over   Relieved by:  Nothing Worsened by:  Nothing Ineffective treatments:  None tried Associated symptoms: no chest pain, no headaches, no shortness of breath and no weakness   Risk factors: hx of vertigo   Risk factors: no anemia     Past Medical History:  Diagnosis Date  . Anxiety   . Ovarian cyst   . Vertigo     There are no active problems to display for this patient.   Past Surgical History:  Procedure Laterality Date  . APPENDECTOMY    . MYOMECTOMY    . TUBAL LIGATION       OB History    Gravida  5   Para  4   Term  2   Preterm  2   AB  1   Living  2     SAB  1   TAB      Ectopic      Multiple      Live Births               Home Medications    Prior to Admission medications   Medication Sig Start Date End Date Taking? Authorizing Provider  meclizine (ANTIVERT) 25 MG tablet Take 1 tablet (25 mg total) by mouth 3 (three) times daily as needed for dizziness. 02/06/18  Yes Long, Arlyss RepressJoshua G, MD  ondansetron (ZOFRAN ODT) 4 MG disintegrating tablet Take 1 tablet (4 mg total) by mouth every 8 (eight) hours as needed for  nausea or vomiting. 02/06/18  Yes Long, Arlyss RepressJoshua G, MD  ibuprofen (ADVIL,MOTRIN) 800 MG tablet Take 1 tablet (800 mg total) by mouth every 8 (eight) hours as needed. 02/06/18   Long, Arlyss RepressJoshua G, MD  LORazepam (ATIVAN) 1 MG tablet Take 1 tablet (1 mg total) by mouth every 8 (eight) hours as needed for anxiety. 11/25/17   Tegeler, Canary Brimhristopher J, MD    Family History Family History  Problem Relation Age of Onset  . Cancer Other   . Heart failure Other   . Diabetes Other     Social History Social History   Tobacco Use  . Smoking status: Current Some Day Smoker    Packs/day: 0.25    Types: Cigarettes  . Smokeless tobacco: Never Used  Substance Use Topics  . Alcohol use: Not Currently  . Drug use: No     Allergies   Other   Review of Systems Review of Systems  Respiratory: Negative for shortness of breath.   Cardiovascular: Negative for chest pain.  Gastrointestinal: Negative for abdominal pain.  Musculoskeletal: Negative for gait problem and neck pain.       Gait witnessed normal  Neurological: Positive for dizziness. Negative for tremors, seizures, syncope, facial asymmetry, speech difficulty, weakness and headaches.       Paresthesias on patchy distal to DIPs  All other systems reviewed and are negative.    Physical Exam Updated Vital Signs BP 109/90 (BP Location: Left Arm)   Pulse 61   Temp 98.5 F (36.9 C) (Oral)   Resp 18   Ht 5\' 3"  (1.6 m)   Wt 55 kg (121 lb 4.8 oz)   LMP 03/30/2018 (Exact Date)   SpO2 100%   BMI 21.49 kg/m   Physical Exam  Constitutional: She appears well-developed and well-nourished. No distress.  HENT:  Head: Normocephalic and atraumatic.  Right Ear: External ear normal.  Left Ear: External ear normal.  Nose: Nose normal.  Mouth/Throat: Oropharynx is clear and moist. No oropharyngeal exudate.  Eyes: Pupils are equal, round, and reactive to light. Conjunctivae and EOM are normal.  Neck: Normal range of motion. Neck supple. No tracheal  deviation present.  Cardiovascular: Normal rate, regular rhythm, normal heart sounds and intact distal pulses.  3+ radial pulses B  Pulmonary/Chest: Effort normal and breath sounds normal. No stridor. No respiratory distress. She has no wheezes. She has no rales.  Abdominal: Soft. Bowel sounds are normal. She exhibits no mass. There is no tenderness. There is no rebound and no guarding.  Musculoskeletal: Normal range of motion. She exhibits no tenderness or deformity.       Right wrist: Normal.       Left wrist: Normal.       Cervical back: Normal.       Thoracic back: Normal.       Lumbar back: Normal.       Right hand: Normal. She exhibits normal capillary refill. Normal sensation noted. Normal strength noted.       Left hand: Normal. She exhibits normal capillary refill. Normal sensation noted. Normal strength noted.  Intact sensation to confrontation in all nerve distributions but states they do not feel sharp  Neurological: She has normal strength. No cranial nerve deficit or sensory deficit. GCS eye subscore is 4. GCS verbal subscore is 5. GCS motor subscore is 6. She displays no Babinski's sign on the right side. She displays no Babinski's sign on the left side.  Reflex Scores:      Tricep reflexes are 2+ on the right side and 2+ on the left side.      Bicep reflexes are 2+ on the right side and 2+ on the left side.      Brachioradialis reflexes are 2+ on the right side and 2+ on the left side.      Patellar reflexes are 2+ on the right side and 2+ on the left side.    ED Treatments / Results  Labs (all labs ordered are listed, but only abnormal results are displayed) Labs Reviewed - No data to display  EKG None  Radiology Ct Head Wo Contrast  Result Date: 04/03/2018 CLINICAL DATA:  Vertigo. Fell 2 days ago. Right frontal region struck a wall. Cannot feel the finger tips bilaterally and cannot move the fingers. EXAM: CT HEAD WITHOUT CONTRAST CT CERVICAL SPINE WITHOUT CONTRAST  TECHNIQUE: Multidetector CT imaging of the head and cervical spine was performed following the standard protocol without intravenous contrast. Multiplanar CT image reconstructions of the cervical spine were also generated. COMPARISON:  CT head 02/12/2016.  CT cervical spine 08/12/2011. FINDINGS: CT HEAD FINDINGS Brain: No evidence of acute infarction, hemorrhage, hydrocephalus, extra-axial collection  or mass lesion/mass effect. Vascular: No hyperdense vessel or unexpected calcification. Skull: Normal. Negative for fracture or focal lesion. Sinuses/Orbits: No acute finding. Other: None. CT CERVICAL SPINE FINDINGS Alignment: There is reversal of the usual cervical lordosis but this appearance is similar to the previous study, likely positional. No anterior subluxation. Normal alignment of the facet joints. C1-2 articulation appears intact. Skull base and vertebrae: Skull base appears intact. No vertebral compression deformities. No focal bone lesion or bone destruction. Soft tissues and spinal canal: No prevertebral soft tissue swelling. No abnormal paraspinal soft tissue mass or infiltration. Disc levels: There is developing degenerative change at C5-6 since previous study with narrowed interspace and small endplate hypertrophic changes present. Upper chest: Lung apices are clear. Other: None. IMPRESSION: 1. No acute intracranial abnormalities. 2. Nonspecific reversal of the usual cervical lordosis similar to previous study, likely positional. No acute displaced fractures identified in the cervical spine. Developing degenerative changes at C5-6. Electronically Signed   By: Burman Nieves M.D.   On: 04/03/2018 01:09   Ct Cervical Spine Wo Contrast  Result Date: 04/03/2018 CLINICAL DATA:  Vertigo. Fell 2 days ago. Right frontal region struck a wall. Cannot feel the finger tips bilaterally and cannot move the fingers. EXAM: CT HEAD WITHOUT CONTRAST CT CERVICAL SPINE WITHOUT CONTRAST TECHNIQUE: Multidetector CT  imaging of the head and cervical spine was performed following the standard protocol without intravenous contrast. Multiplanar CT image reconstructions of the cervical spine were also generated. COMPARISON:  CT head 02/12/2016.  CT cervical spine 08/12/2011. FINDINGS: CT HEAD FINDINGS Brain: No evidence of acute infarction, hemorrhage, hydrocephalus, extra-axial collection or mass lesion/mass effect. Vascular: No hyperdense vessel or unexpected calcification. Skull: Normal. Negative for fracture or focal lesion. Sinuses/Orbits: No acute finding. Other: None. CT CERVICAL SPINE FINDINGS Alignment: There is reversal of the usual cervical lordosis but this appearance is similar to the previous study, likely positional. No anterior subluxation. Normal alignment of the facet joints. C1-2 articulation appears intact. Skull base and vertebrae: Skull base appears intact. No vertebral compression deformities. No focal bone lesion or bone destruction. Soft tissues and spinal canal: No prevertebral soft tissue swelling. No abnormal paraspinal soft tissue mass or infiltration. Disc levels: There is developing degenerative change at C5-6 since previous study with narrowed interspace and small endplate hypertrophic changes present. Upper chest: Lung apices are clear. Other: None. IMPRESSION: 1. No acute intracranial abnormalities. 2. Nonspecific reversal of the usual cervical lordosis similar to previous study, likely positional. No acute displaced fractures identified in the cervical spine. Developing degenerative changes at C5-6. Electronically Signed   By: Burman Nieves M.D.   On: 04/03/2018 01:09    Procedures Procedures (including critical care time)  Medications Ordered in ED Medications  naproxen (NAPROSYN) tablet 500 mg (has no administration in time range)  acetaminophen (TYLENOL) tablet 1,000 mg (has no administration in time range)       Final Clinical Impressions(s) / ED Diagnoses   Patient clearly  feels fingers on exam on direct confrontation.  This was a low risk mechanism and there are no injuries on imaging to explain these symptoms.  5/5 strength in all 4 extremities with intact DTRs and negative babinski's Bilaterally, the paresthesias are patchy and do not conform to a pattern and all are just the tips of the fingers, distal to the DIP.  I suspect this is anxiety, given the patient's history.  This is not central cord syndrome and I see no lesion that would account for this centrally.  I do not believe an MRI will further our evaluation, but will refer to neurology for ongoing testing.    Return for pain, numbness, changes in vision or speech, fevers >100.4 unrelieved by medication, shortness of breath, intractable vomiting, or diarrhea, abdominal pain, Inability to tolerate liquids or food, cough, altered mental status or any concerns. No signs of systemic illness or infection. The patient is nontoxic-appearing on exam and vital signs are within normal limits. Will refer to urology for microscopy hematuria as patient is asymptomatic.  I have reviewed the triage vital signs and the nursing notes. Pertinent labs &imaging results that were available during my care of the patient were reviewed by me and considered in my medical decision making (see chart for details).  After history, exam, and medical workup I feel the patient has been appropriately medically screened and is safe for discharge home. Pertinent diagnoses were discussed with the patient. Patient was given return precautions.      Jelene Albano, MD 04/03/18 Azzie Roup, Jaylianna Tatlock, MD 04/03/18 1610

## 2018-05-19 ENCOUNTER — Encounter (HOSPITAL_BASED_OUTPATIENT_CLINIC_OR_DEPARTMENT_OTHER): Payer: Self-pay | Admitting: Emergency Medicine

## 2018-05-19 ENCOUNTER — Emergency Department (HOSPITAL_BASED_OUTPATIENT_CLINIC_OR_DEPARTMENT_OTHER)
Admission: EM | Admit: 2018-05-19 | Discharge: 2018-05-20 | Disposition: A | Discharge status: Other Acute Inpatient Hospital | Payer: Medicaid Other | Attending: Emergency Medicine | Admitting: Emergency Medicine

## 2018-05-19 ENCOUNTER — Other Ambulatory Visit: Payer: Self-pay

## 2018-05-19 ENCOUNTER — Other Ambulatory Visit: Payer: Self-pay | Admitting: Psychiatry

## 2018-05-19 DIAGNOSIS — F329 Major depressive disorder, single episode, unspecified: Secondary | ICD-10-CM | POA: Diagnosis present

## 2018-05-19 DIAGNOSIS — F411 Generalized anxiety disorder: Secondary | ICD-10-CM | POA: Diagnosis not present

## 2018-05-19 DIAGNOSIS — R45851 Suicidal ideations: Secondary | ICD-10-CM | POA: Insufficient documentation

## 2018-05-19 DIAGNOSIS — F332 Major depressive disorder, recurrent severe without psychotic features: Secondary | ICD-10-CM

## 2018-05-19 DIAGNOSIS — F17210 Nicotine dependence, cigarettes, uncomplicated: Secondary | ICD-10-CM | POA: Insufficient documentation

## 2018-05-19 LAB — COMPREHENSIVE METABOLIC PANEL
ALT: 11 U/L (ref 0–44)
AST: 16 U/L (ref 15–41)
Albumin: 4.3 g/dL (ref 3.5–5.0)
Alkaline Phosphatase: 94 U/L (ref 38–126)
Anion gap: 8 (ref 5–15)
BILIRUBIN TOTAL: 0.6 mg/dL (ref 0.3–1.2)
BUN: 8 mg/dL (ref 6–20)
CO2: 25 mmol/L (ref 22–32)
CREATININE: 0.59 mg/dL (ref 0.44–1.00)
Calcium: 9.1 mg/dL (ref 8.9–10.3)
Chloride: 106 mmol/L (ref 98–111)
GFR calc Af Amer: >60 mL/min (ref >60)
Glucose, Bld: 87 mg/dL (ref 70–99)
Potassium: 3.5 mmol/L (ref 3.5–5.1)
Sodium: 139 mmol/L (ref 135–145)
Total Protein: 7.2 g/dL (ref 6.5–8.1)

## 2018-05-19 LAB — RAPID URINE DRUG SCREEN, HOSP PERFORMED
AMPHETAMINES: NOT DETECTED
BARBITURATES: NOT DETECTED
Benzodiazepines: NOT DETECTED
COCAINE: NOT DETECTED
Opiates: NOT DETECTED
Tetrahydrocannabinol: POSITIVE — AB

## 2018-05-19 LAB — URINALYSIS, MICROSCOPIC (REFLEX)

## 2018-05-19 LAB — PREGNANCY, URINE: Preg Test, Ur: NEGATIVE

## 2018-05-19 LAB — URINALYSIS, ROUTINE W REFLEX MICROSCOPIC
BILIRUBIN URINE: NEGATIVE
Glucose, UA: NEGATIVE mg/dL
KETONES UR: NEGATIVE mg/dL
Nitrite: POSITIVE — AB
PH: 6.5 (ref 5.0–8.0)
Protein, ur: NEGATIVE mg/dL
Specific Gravity, Urine: 1.02 (ref 1.005–1.030)

## 2018-05-19 LAB — CBC
HEMATOCRIT: 41.7 % (ref 36.0–46.0)
HEMOGLOBIN: 13.8 g/dL (ref 12.0–15.0)
MCH: 31.5 pg (ref 26.0–34.0)
MCHC: 33.1 g/dL (ref 30.0–36.0)
MCV: 95.2 fL (ref 78.0–100.0)
Platelets: 343 10*3/uL (ref 150–400)
RBC: 4.38 MIL/uL (ref 3.87–5.11)
RDW: 12.3 % (ref 11.5–15.5)
WBC: 8.2 10*3/uL (ref 4.0–10.5)

## 2018-05-19 LAB — SALICYLATE LEVEL: Salicylate Lvl: <7 mg/dL (ref 2.8–30.0)

## 2018-05-19 LAB — ETHANOL: Alcohol, Ethyl (B): <10 mg/dL (ref <10)

## 2018-05-19 LAB — ACETAMINOPHEN LEVEL: Acetaminophen (Tylenol), Serum: <10 ug/mL — ABNORMAL LOW (ref 10–30)

## 2018-05-19 MED ORDER — TRAZODONE HCL 50 MG PO TABS: 100.0000 mg | ORAL_TABLET | Freq: Every evening | ORAL | Status: DC | PRN

## 2018-05-19 MED ORDER — SODIUM CHLORIDE 0.9 % IV SOLN
1.0000 g | Freq: Once | INTRAVENOUS | Status: AC
  Administered 2018-05-19: 1 g via INTRAVENOUS
  Filled 2018-05-19: qty 10

## 2018-05-19 MED ORDER — CEPHALEXIN 250 MG PO CAPS: 500.0000 mg | ORAL_CAPSULE | Freq: Two times a day (BID) | ORAL | Status: DC

## 2018-05-19 MED ORDER — HYDROXYZINE HCL 25 MG PO TABS: 50.0000 mg | ORAL_TABLET | Freq: Four times a day (QID) | ORAL | Status: DC | PRN

## 2018-05-19 MED ORDER — SODIUM CHLORIDE 0.9 % IV BOLUS
1000.0000 mL | Freq: Once | INTRAVENOUS | Status: AC
  Administered 2018-05-19: 1000 mL via INTRAVENOUS

## 2018-05-19 MED ORDER — ONDANSETRON HCL 4 MG/2ML IJ SOLN
4.0000 mg | Freq: Once | INTRAMUSCULAR | Status: AC
  Administered 2018-05-19: 4 mg via INTRAVENOUS
  Filled 2018-05-19: qty 2

## 2018-05-19 NOTE — ED Notes (Signed)
Patient has been crying when I saw her.  PA is currently at bedside.

## 2018-05-19 NOTE — ED Notes (Signed)
Disposition: Initial Assessment Completed for this Encounter: Yes  Jason Berry, NP, patient meets inpatient criteria. TTS to secure placement.  

## 2018-05-19 NOTE — ED Notes (Signed)
Patient stated that she flushed all her meds in the toilet because she is scared that she might take all of it.  SHe stated that she has not been able to sleep for 2 weeks.

## 2018-05-19 NOTE — ED Notes (Signed)
Pt contracts for safety, aware of need for iv to receive antibiotics and iv fluids, when asked to contract for safety pt states "I promise I am not going to hurt myself, I just want to get better."

## 2018-05-19 NOTE — ED Triage Notes (Signed)
Pt reports increase in anxiety over the last several weeks with vomiting, insomnia and generalized pain. Pt states she has a hx of anxiety. Pt endorses SI.
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
## 2018-05-19 NOTE — ED Provider Notes (Signed)
[facility] EMERGENCY DEPARTMENT Provider Note  CSN: [id number] Arrival date & time: 05/19/18  1418  History   Chief Complaint Chief Complaint  Patient presents with  . Suicidal  . Anxiety   HPI Alicia Brown is a 32 y.o. female with a psychiatric history of anxiety, depression and insomnia and medical history of vertigo and numerous gyn issues who presented to the ED for anxiety and suicidal ideation. Patient reports that she asked her mother to transport her to the ED today because the anxiety because became overwhelming and she began to have SI. She denies current suicide plan, but states that she dumped out all of her medication thinking about taking them while she was at home and then flushed them down the toilet. She endorses numerous psychosocial stressors over the last 2 weeks related to family and work, but does not provide more detail. Also reports: insomnia (sleeping ~2 hours daily), decreased appetite, crying spells, depressed mood and increasing anxiety attacks (which present as palpitations, tachypnea and sweatiness).  Patient has physical complaints of lower abdominal cramping and urinary frequency and urgency x4 days. Associated symptom: nausea. Denies dysuria, hematuria, vaginal pain, vaginal bleeding or discharge, blood in stool or changes in bowel habits.  Past Medical History:  Diagnosis Date  . Anxiety   . Ovarian cyst   . Vertigo     There are no active problems to display for this patient.   Past Surgical History:  Procedure Laterality Date  . APPENDECTOMY    . MYOMECTOMY    . TUBAL LIGATION       OB History    Gravida  5   Para  4   Term  2   Preterm  2   AB  1   Living  2     SAB  1   TAB      Ectopic      Multiple      Live Births               Home Medications    Prior to Admission medications   Medication Sig Start Date End Date Taking? Authorizing Provider  ibuprofen (ADVIL,MOTRIN) 800 MG tablet Take 1  tablet (800 mg total) by mouth every 8 (eight) hours as needed. 02/06/18  Yes Long, Arlyss Repress, MD  LORazepam (ATIVAN) 1 MG tablet Take 1 tablet (1 mg total) by mouth every 8 (eight) hours as needed for anxiety. 11/25/17   Tegeler, Canary Brim, MD  meclizine (ANTIVERT) 25 MG tablet Take 1 tablet (25 mg total) by mouth 3 (three) times daily as needed for dizziness. 02/06/18   Long, Arlyss Repress, MD  ondansetron (ZOFRAN ODT) 4 MG disintegrating tablet Take 1 tablet (4 mg total) by mouth every 8 (eight) hours as needed for nausea or vomiting. 02/06/18   Long, Arlyss Repress, MD    Family History Family History  Problem Relation Age of Onset  . Cancer Other   . Heart failure Other   . Diabetes Other     Social History Social History   Tobacco Use  . Smoking status: Current Some Day Smoker    Packs/day: 0.25    Types: Cigarettes  . Smokeless tobacco: Never Used  Substance Use Topics  . Alcohol use: Not Currently  . Drug use: Yes    Types: Marijuana     Allergies   Other   Review of Systems Review of Systems  Constitutional: Positive for appetite change and fatigue. Negative for chills  and fever.  HENT: Negative.   Eyes: Negative.   Respiratory: Negative.   Cardiovascular: Negative.   Gastrointestinal: Positive for abdominal pain and nausea. Negative for constipation, diarrhea and vomiting.  Genitourinary: Positive for frequency and urgency. Negative for difficulty urinating, dysuria, hematuria, vaginal bleeding, vaginal discharge and vaginal pain.  Musculoskeletal: Negative.   Skin: Negative.   Neurological: Negative.   Psychiatric/Behavioral: Positive for dysphoric mood, sleep disturbance and suicidal ideas. The patient is nervous/anxious.    Physical Exam Updated Vital Signs BP 117/80 (BP Location: Left Arm)   Pulse (!) 52   Temp 98.5 F (36.9 C) (Oral)   Resp 16   Ht 5\' 2"  (1.575 m)   Wt 52.2 kg   LMP 03/25/2018 (Approximate)   SpO2 98%   BMI 21.03 kg/m   Physical Exam    Constitutional: She appears well-developed and well-nourished.  HENT:  Head: Normocephalic and atraumatic.  Mouth/Throat: Uvula is midline, oropharynx is clear and moist and mucous membranes are normal.  Eyes: Pupils are equal, round, and reactive to light. Conjunctivae, EOM and lids are normal.  Neck: Normal range of motion and full passive range of motion without pain. Neck supple.  Cardiovascular: Normal rate and regular rhythm.  No murmur heard. Pulses:      Radial pulses are 2+ on the right side, and 2+ on the left side.       Dorsalis pedis pulses are 2+ on the right side, and 2+ on the left side.       Posterior tibial pulses are 2+ on the right side, and 2+ on the left side.  Pulmonary/Chest: Effort normal and breath sounds normal.  Abdominal: Soft. Normal appearance and bowel sounds are normal. There is tenderness in the suprapubic area. There is no rigidity and no guarding.  Musculoskeletal: Normal range of motion.  Neurological: She is alert. She has normal strength. No cranial nerve deficit or sensory deficit. She exhibits normal muscle tone.  Skin: Skin is warm. Capillary refill takes less than 2 seconds.  No evidence of self-inflicted injuries.  Psychiatric: Her mood appears anxious. She is withdrawn. Cognition and memory are normal. She exhibits a depressed mood. She expresses suicidal ideation.  Tearful most of the interview. Gives poor eye contact. Speaks softly.  Nursing note and vitals reviewed.  ED Treatments / Results  Labs (all labs ordered are listed, but only abnormal results are displayed) Labs Reviewed  ACETAMINOPHEN LEVEL - Abnormal; Notable for the following components:      Result Value   Acetaminophen (Tylenol), Serum <10 (*)    All other components within normal limits  RAPID URINE DRUG SCREEN, HOSP PERFORMED - Abnormal; Notable for the following components:   Tetrahydrocannabinol POSITIVE (*)    All other components within normal limits  URINALYSIS,  ROUTINE W REFLEX MICROSCOPIC - Abnormal; Notable for the following components:   APPearance CLOUDY (*)    Hgb urine dipstick TRACE (*)    Nitrite POSITIVE (*)    Leukocytes, UA SMALL (*)    All other components within normal limits  URINALYSIS, MICROSCOPIC (REFLEX) - Abnormal; Notable for the following components:   Bacteria, UA MANY (*)    All other components within normal limits  COMPREHENSIVE METABOLIC PANEL  ETHANOL  SALICYLATE LEVEL  CBC  PREGNANCY, URINE    EKG None  Radiology No results found.  Procedures Procedures (including critical care time)  Medications Ordered in ED Medications  cephALEXin (KEFLEX) capsule 500 mg (has no administration in time range)  traZODone (DESYREL) tablet 100 mg (has no administration in time range)  hydrOXYzine (ATARAX/VISTARIL) tablet 50 mg (has no administration in time range)  cefTRIAXone (ROCEPHIN) 1 g in sodium chloride 0.9 % 100 mL IVPB (0 g Intravenous Stopped 05/19/18 1727)  sodium chloride 0.9 % bolus 1,000 mL (0 mLs Intravenous Stopped 05/19/18 1758)  ondansetron (ZOFRAN) injection 4 mg (4 mg Intravenous Given 05/19/18 1649)     Initial Impression / Assessment and Plan / ED Course  Triage vital signs and the nursing notes have been reviewed.  Pertinent labs & imaging results that were available during care of the patient were reviewed and considered in medical decision making (see chart for details).  Patient presents to the ED primarily for anxiety and SI which occurs in the context of numerous psychosocial stressors. Patient has past psychiatric history of anxiety and depression and has been on different psychiatric medications in the past, but states that she is currently not on any medication. Patient continues to endorse SI on interview.  Medically, patient complains of lower abdominal pain, urinary frequency and urgency x5 days. Will order UA to evaluate for UTI and will order other labs to evaluate for other underlying  issues that may require intervention before behavioral health assessment.  Clinical Course as of May 19 2212  Tue May 19, 2018  1636 UA suggestive of UTI. Will start treatment with IV Rocephin 1g x1. Can be discharged with PO antibiotics pending dispo by TTS. Remaining labs unremarkable. Patient is medically cleared. Will place TTS consult for further psych evaluation and dispo.   [GM]  2209 Case discussed with TTS and patient will be admitted to inpatient psychiatric unit. Will order PO Keflex 500mg  BID x5 days for UTI. No further IV medication is necessary.   [GM]    Clinical Course User Index [GM] Amori Colomb, Sharyon Medicus, PA-C   Final Clinical Impressions(s) / ED Diagnoses  1. MDD, GAD. Consult placed with TTS. Patient will be admitted to [facility] for further psychiatric treatment. PRN medications for anxiety and insomnia ordered. 2. UTI. Rocephin 1g x1 given in the ED. Patient can continue treatment with Keflex 500mg  BID x4 days. This was discussed with TTS. Patient no longer requires IV antibiotics for treatment.  Dispo: Admit to [facility].  Final diagnoses:  Severe episode of recurrent major depressive disorder, without psychotic features (HCC)  GAD (generalized anxiety disorder)    ED Discharge Orders    None        Reva Bores 05/19/18 2213    Mesner, Barbara Cower, MD 05/20/18 [phone]

## 2018-05-19 NOTE — ED Notes (Signed)
Patient's belonging is sent with patient's mother, Cheryll Carawan, ([phone]).  Mother wants to be called when patient is transferred.

## 2018-05-20 ENCOUNTER — Inpatient Hospital Stay
Admission: AD | Admit: 2018-05-20 | Discharge: 2018-05-25 | DRG: 885 | Disposition: A | Discharge status: Home / Self Care | Payer: Medicaid Other | Source: Intra-hospital | Attending: Psychiatry | Admitting: Psychiatry

## 2018-05-20 DIAGNOSIS — R45851 Suicidal ideations: Secondary | ICD-10-CM | POA: Diagnosis present

## 2018-05-20 DIAGNOSIS — Z818 Family history of other mental and behavioral disorders: Secondary | ICD-10-CM

## 2018-05-20 DIAGNOSIS — F1220 Cannabis dependence, uncomplicated: Secondary | ICD-10-CM | POA: Diagnosis present

## 2018-05-20 DIAGNOSIS — F4310 Post-traumatic stress disorder, unspecified: Secondary | ICD-10-CM | POA: Diagnosis present

## 2018-05-20 DIAGNOSIS — N390 Urinary tract infection, site not specified: Secondary | ICD-10-CM | POA: Diagnosis present

## 2018-05-20 DIAGNOSIS — F332 Major depressive disorder, recurrent severe without psychotic features: Principal | ICD-10-CM

## 2018-05-20 DIAGNOSIS — F429 Obsessive-compulsive disorder, unspecified: Secondary | ICD-10-CM | POA: Diagnosis present

## 2018-05-20 DIAGNOSIS — G4700 Insomnia, unspecified: Secondary | ICD-10-CM | POA: Diagnosis present

## 2018-05-20 DIAGNOSIS — F1290 Cannabis use, unspecified, uncomplicated: Secondary | ICD-10-CM | POA: Diagnosis not present

## 2018-05-20 DIAGNOSIS — F17210 Nicotine dependence, cigarettes, uncomplicated: Secondary | ICD-10-CM | POA: Diagnosis present

## 2018-05-20 DIAGNOSIS — F17200 Nicotine dependence, unspecified, uncomplicated: Secondary | ICD-10-CM | POA: Diagnosis present

## 2018-05-20 DIAGNOSIS — F419 Anxiety disorder, unspecified: Secondary | ICD-10-CM | POA: Diagnosis not present

## 2018-05-20 MED ORDER — NICOTINE 21 MG/24HR TD PT24
21.0000 mg | MEDICATED_PATCH | Freq: Every day | TRANSDERMAL | Status: DC
  Administered 2018-05-20 – 2018-05-25 (×6): 21 mg via TRANSDERMAL
  Filled 2018-05-20 (×5): qty 1

## 2018-05-20 MED ORDER — ACETAMINOPHEN 325 MG PO TABS
650.0000 mg | ORAL_TABLET | Freq: Four times a day (QID) | ORAL | Status: DC | PRN
  Administered 2018-05-20 – 2018-05-22 (×2): 650 mg via ORAL
  Filled 2018-05-20 (×2): qty 2

## 2018-05-20 MED ORDER — TRAZODONE HCL 100 MG PO TABS: 100.0000 mg | ORAL_TABLET | Freq: Every evening | ORAL | Status: DC | PRN

## 2018-05-20 MED ORDER — LAMOTRIGINE 25 MG PO TABS
25.0000 mg | ORAL_TABLET | Freq: Every day | ORAL | Status: DC
  Administered 2018-05-20 – 2018-05-24 (×5): 25 mg via ORAL
  Filled 2018-05-20 (×5): qty 1

## 2018-05-20 MED ORDER — MAGNESIUM HYDROXIDE 400 MG/5ML PO SUSP: 30.0000 mL | Freq: Every day | ORAL | Status: DC | PRN

## 2018-05-20 MED ORDER — PRAZOSIN HCL 2 MG PO CAPS
2.0000 mg | ORAL_CAPSULE | Freq: Once | ORAL | Status: AC
  Administered 2018-05-20: 2 mg via ORAL
  Filled 2018-05-20: qty 1

## 2018-05-20 MED ORDER — FLUVOXAMINE MALEATE 50 MG PO TABS
50.0000 mg | ORAL_TABLET | Freq: Every day | ORAL | Status: DC
  Administered 2018-05-20: 50 mg via ORAL
  Filled 2018-05-20: qty 1

## 2018-05-20 MED ORDER — ALUM & MAG HYDROXIDE-SIMETH 200-200-20 MG/5ML PO SUSP
30.0000 mL | ORAL | Status: DC | PRN
  Administered 2018-05-24: 30 mL via ORAL
  Filled 2018-05-20: qty 30

## 2018-05-20 MED ORDER — HYDROXYZINE HCL 50 MG PO TABS
50.0000 mg | ORAL_TABLET | Freq: Three times a day (TID) | ORAL | Status: DC | PRN
  Administered 2018-05-20 – 2018-05-21 (×2): 50 mg via ORAL
  Filled 2018-05-20 (×2): qty 1

## 2018-05-20 MED ORDER — ZOLPIDEM TARTRATE 5 MG PO TABS
5.0000 mg | ORAL_TABLET | Freq: Every evening | ORAL | Status: DC | PRN
  Administered 2018-05-20 – 2018-05-22 (×3): 5 mg via ORAL
  Filled 2018-05-20 (×4): qty 1

## 2018-05-20 NOTE — Tx Team (Signed)
Initial Treatment Plan 05/20/2018 3:03 AM Alicia Brown [id number]    PATIENT STRESSORS: Financial difficulties Medication change or noncompliance Substance abuse   PATIENT STRENGTHS: Capable of independent living Motivation for treatment/growth Supportive family/friends   PATIENT IDENTIFIED PROBLEMS: [place] Drug use    Financial difficulties    Non-compliant with prescribed medications    Depression/Anxiety         DISCHARGE CRITERIA:  Adequate post-discharge living arrangements Improved stabilization in mood, thinking, and/or behavior Motivation to continue treatment in a less acute level of care Safe-care adequate arrangements made  PRELIMINARY DISCHARGE PLAN: Attend 12-step recovery group Participate in family therapy Return to previous living arrangement  PATIENT/FAMILY INVOLVEMENT: This treatment plan has been presented to and reviewed with the patient, Alicia Brown,.  The patient  have been given the opportunity to ask questions and make suggestions.  Lelan Pons, RN 05/20/2018, 3:03 AM

## 2018-05-20 NOTE — [facility] Suicide Risk Assessment (Signed)
[facility] Admission Suicide Risk Assessment   Nursing information obtained from:  Patient Demographic factors:  NA Current Mental Status:  Self-harm thoughts Loss Factors:  Financial problems / change in socioeconomic status Historical Factors:  NA Risk Reduction Factors:  Positive therapeutic relationship  Total Time spent with patient: 1 hour Principal Problem: Severe recurrent major depression without psychotic features (HCC) Diagnosis:   Patient Active Problem List   Diagnosis Date Noted  . Severe recurrent major depression without psychotic features (HCC) [F33.2] 05/20/2018    Priority: High  . Cannabis use disorder, moderate, dependence (HCC) [F12.20] 05/20/2018  . Suicidal ideation [R45.851] 05/20/2018  . UTI (urinary tract infection) [N39.0] 05/20/2018  . Tobacco use disorder [F17.200] 05/20/2018  . OCD (obsessive compulsive disorder) [F42.9] 05/20/2018  . PTSD (post-traumatic stress disorder) [F43.10] 05/20/2018   Subjective Data: suicidal ideation  Continued Clinical Symptoms:  Alcohol Use Disorder Identification Test Final Score (AUDIT): 5 The "Alcohol Use Disorders Identification Test", Guidelines for Use in Primary Care, Second Edition.  World Science writer [facility] Score between 0-7:  no or low risk or alcohol related problems. Score between 8-15:  moderate risk of alcohol related problems. Score between 16-19:  high risk of alcohol related problems. Score 20 or above:  warrants further diagnostic evaluation for alcohol dependence and treatment.   CLINICAL FACTORS:   Severe Anxiety and/or Agitation Panic Attacks Depression:   Impulsivity Insomnia Obsessive-Compulsive Disorder   Musculoskeletal: Strength & Muscle Tone: within normal limits Gait & Station: normal Patient leans: N/A  Psychiatric Specialty Exam: Physical Exam  Nursing note and vitals reviewed. Psychiatric: Her speech is normal. Her mood appears anxious. Her affect is blunt. She is withdrawn.  Cognition and memory are normal. She expresses impulsivity. She exhibits a depressed mood. She expresses suicidal ideation. She expresses suicidal plans.    Review of Systems  Neurological: Negative.   Psychiatric/Behavioral: Positive for depression and suicidal ideas. The patient is nervous/anxious and has insomnia.   All other systems reviewed and are negative.   Blood pressure 126/86, pulse (!) 49, temperature 98 F (36.7 C), temperature source Oral, resp. rate 16, height 5\' 2"  (1.575 m), weight 52.2 kg, SpO2 100 %.Body mass index is 21.03 kg/m.  General Appearance: Casual  Eye Contact:  Good  Speech:  Normal Rate  Volume:  Decreased  Mood:  Anxious and Depressed  Affect:  Flat  Thought Process:  Goal Directed and Descriptions of Associations: Intact  Orientation:  Full (Time, Place, and Person)  Thought Content:  WDL  Suicidal Thoughts:  Yes.  with intent/plan  Homicidal Thoughts:  No  Memory:  Immediate;   Fair Recent;   Fair Remote;   Fair  Judgement:  Fair  Insight:  Good  Psychomotor Activity:  Psychomotor Retardation  Concentration:  Concentration: Fair and Attention Span: Fair  Recall:  [employer] of Knowledge:  Fair  Language:  Fair  Akathisia:  No  Handed:  Right  AIMS (if indicated):     Assets:  Communication Skills Desire for Improvement Financial Resources/Insurance Housing Physical Health Resilience Social Support Talents/Skills Vocational/Educational  ADL's:  Intact  Cognition:  WNL  Sleep:  Number of Hours: 1.5      COGNITIVE FEATURES THAT CONTRIBUTE TO RISK:  None    SUICIDE RISK:   Severe:  Frequent, intense, and enduring suicidal ideation, specific plan, no subjective intent, but some objective markers of intent (i.e., choice of lethal method), the method is accessible, some limited preparatory behavior, evidence of impaired self-control, severe  dysphoria/symptomatology, multiple risk factors present, and few if any protective factors,  particularly a lack of social support.  PLAN OF CARE: hospital admission, medication management, discharge planning.  Ms. Lavoy is a 32 year old female with a history of depression and severe anxiety admitted for aborted suicide attempt by overdose.  #Suicidal ideation -patient able to contract for safety  #Mood -start Luvox 50 mg nightly for depression, anxiety -start Lamictal 25 mf for mood stabilization  #Anxiety -give 2 mg Minipress for PTSD -monitor BP  #Insomnia -Ambien 5 mg  #Smoking cessation -Nicotine patch is available  #Labs -lipid panel, TSH, A1C -EKG -pregnancy test  #Disposition -discharge with family -follow up with [facility]    I certify that inpatient services furnished can reasonably be expected to improve the patient's condition.   Kristine Linea, MD 05/20/2018, 3:45 PM

## 2018-05-20 NOTE — ED Notes (Signed)
Pt's mom Xienna Billy [phone]. Mom called and message left to notified that pt is going to be transfer tonight to [facility].

## 2018-05-20 NOTE — H&P (Signed)
Psychiatric Admission Assessment Adult  Patient Identification: Alicia Brown MRN:  [id number] Date of Evaluation:  05/20/2018 Chief Complaint:  Major Depressive Disorder Principal Diagnosis: Severe recurrent major depression without psychotic features ([place]) Diagnosis:   Patient Active Problem List   Diagnosis Date Noted  . Severe recurrent major depression without psychotic features ([place]) [F33.2] 05/20/2018    Priority: High  . Cannabis use disorder, moderate, dependence ([place]) [F12.20] 05/20/2018  . Suicidal ideation [R45.851] 05/20/2018  . UTI (urinary tract infection) [N39.0] 05/20/2018  . Tobacco use disorder [F17.200] 05/20/2018  . OCD (obsessive compulsive disorder) [F42.9] 05/20/2018  . PTSD (post-traumatic stress disorder) [F43.10] 05/20/2018   History of Present Illness:   Alicia Brown is a 32 year old female with a history of depression and anxiety.  Chief complaint. "Life snowballing."  History of present illness. Information was obtained from the patient and the chart. The patient came to the ER complaining of strong urges of two day duration to overdose on her medications. She realized, that she had access to over 200 pils of different kind and flushed them down the toiled afraid that she could do something irresponsible. It was very difficult to come to the ER because od stigma, her family rejecting the need for medications and fear that authorities might be involved when children ar left behind. She is an excellent mother and the children are safe with the grandmother, aunt and her sister. The patient has suffered severe anxiety most of her life with frequent and incapacitating panic attacks, severe social anxiety and agoraphobia, nightmares and flashbacks of PTSD and severe OCD symptoms. She denies psychotic symptoms except excessive worries about her children that borders on paranoia. The patient has had numerous severe stressors including major losses, unstable  relationship with her finace who now fathers a baby wit another woman, challenges of raising special needs son with Asperger's. In fact, the last drop was her son's suspension from school after he stabbed his bully with a pencil. She is a devoted mother. The kids in return, take care of her in case of severe panic attacks, especially in public. In fact, she very rarely ventures out of the house, almost never during the day. She has almost daily panic attacks that sometimes lst hours in spite of her coping skills. Recently, she lost consciousness while hyperventilating during panic attack, hit her head on the counter and broke two teeth.  Past psychiatric history. Never hospitalized, no suicide attempts. She was prescribed a sleeping pill and another for depression but did not take them as her family was against pharmacotherapy for religious reasons.   Family psychiatric history. Father with severe OCD. Two of his sisters with mental illness. One paranoid another died in car accident after "family intervention".  Social history. She graduated from high school and went to college some for early childhood education. She is a single mother of two children ages 40 and 79, the younger with Asperger's. No relationship with the father of her children. Her fiance is a Administrator, supportive but rarely at home. Has supportive family with mom, aunt and sister around. She has difficulties with employment due to severe anxiety and agoraphobia.   Total Time spent with patient: 1 hour  Is the patient at risk to self? Yes.    Has the patient been a risk to self in the past 6 months? No.  Has the patient been a risk to self within the distant past? No.  Is the patient a risk to others? No.  Has the patient been a risk to others in the past 6 months? No.  Has the patient been a risk to others within the distant past? No.   Prior Inpatient Therapy:   Prior Outpatient Therapy:    Alcohol Screening: 1. How often do  you have a drink containing alcohol?: 2 to 4 times a month 2. How many drinks containing alcohol do you have on a typical day when you are drinking?: 1 or 2 3. How often do you have six or more drinks on one occasion?: Never AUDIT-C Score: 2 4. How often during the last year have you found that you were not able to stop drinking once you had started?: Less than monthly 5. How often during the last year have you failed to do what was normally expected from you becasue of drinking?: Less than monthly 6. How often during the last year have you needed a first drink in the morning to get yourself going after a heavy drinking session?: Never 7. How often during the last year have you had a feeling of guilt of remorse after drinking?: Never 8. How often during the last year have you been unable to remember what happened the night before because you had been drinking?: Less than monthly 9. Have you or someone else been injured as a result of your drinking?: No 10. Has a relative or friend or a doctor or another health worker been concerned about your drinking or suggested you cut down?: No Alcohol Use Disorder Identification Test Final Score (AUDIT): 5 Intervention/Follow-up: Alcohol Education Substance Abuse History in the last 12 months:  No. Consequences of Substance Abuse: NA Previous Psychotropic Medications: Yes  Psychological Evaluations: No  Past Medical History:  Past Medical History:  Diagnosis Date  . Anxiety   . Ovarian cyst   . Vertigo     Past Surgical History:  Procedure Laterality Date  . APPENDECTOMY    . MYOMECTOMY    . TUBAL LIGATION     Family History:  Family History  Problem Relation Age of Onset  . Cancer Other   . Heart failure Other   . Diabetes Other    Tobacco Screening: Have you used any form of tobacco in the last 30 days? (Cigarettes, Smokeless Tobacco, Cigars, and/or Pipes): Yes Tobacco use, Select all that apply: 5 or more cigarettes per day Are you  interested in Tobacco Cessation Medications?: Yes, will notify MD for an order Counseled patient on smoking cessation including recognizing danger situations, developing coping skills and basic information about quitting provided: Yes Social History:  Social History   Substance and Sexual Activity  Alcohol Use Not Currently     Social History   Substance and Sexual Activity  Drug Use Yes  . Types: Marijuana    Additional Social History: Marital status: Long term relationship What types of issues is patient dealing with in the relationship?: Pt. reports that her fiance has another woman pregnant Are you sexually active?: Yes What is your sexual orientation?: Heterosexual Has your sexual activity been affected by drugs, alcohol, medication, or emotional stress?: Yes Does patient have children?: Yes How many children?: 2 How is patient's relationship with their children?: "Today is the first day of my sons 5th grade school career that I'm not there. My daughter doesnt require that much. My son is on the spectrum I love them. It's hard."  Allergies:   Allergies  Allergen Reactions  . Other     "any type of lubrication"   Lab Results:  Results for orders placed or performed during the hospital encounter of 05/19/18 (from the past 48 hour(s))  Comprehensive metabolic panel     Status: None   Collection Time: 05/19/18  3:19 PM  Result Value Ref Range   Sodium 139 135 - 145 mmol/L   Potassium 3.5 3.5 - 5.1 mmol/L   Chloride 106 98 - 111 mmol/L   CO2 25 22 - 32 mmol/L   Glucose, Bld 87 70 - 99 mg/dL   BUN 8 6 - 20 mg/dL   Creatinine, Ser 0.59 0.44 - 1.00 mg/dL   Calcium 9.1 8.9 - 10.3 mg/dL   Total Protein 7.2 6.5 - 8.1 g/dL   Albumin 4.3 3.5 - 5.0 g/dL   AST 16 15 - 41 U/L   ALT 11 0 - 44 U/L   Alkaline Phosphatase 94 38 - 126 U/L   Total Bilirubin 0.6 0.3 - 1.2 mg/dL   GFR calc non Af Amer >60 >60 mL/min   GFR calc Af Amer >60 >60 mL/min     Comment: (NOTE) The eGFR has been calculated using the CKD EPI equation. This calculation has not been validated in all clinical situations. eGFR's persistently <60 mL/min signify possible Chronic Kidney Disease.    Anion gap 8 5 - 15    Comment: Performed at [facility], [address] [place], [place] [address]  Ethanol     Status: None   Collection Time: 05/19/18  3:19 PM  Result Value Ref Range   Alcohol, Ethyl (B) <10 <10 mg/dL    Comment:        LOWEST DETECTABLE LIMIT FOR SERUM ALCOHOL IS 10 mg/dL FOR MEDICAL PURPOSES ONLY Performed at [facility], [address] [place], [place] [address]   Salicylate level     Status: None   Collection Time: 05/19/18  3:19 PM  Result Value Ref Range   Salicylate Lvl <6.7 2.8 - 30.0 mg/dL    Comment: Performed at [facility], [address] [place], [place] [address]  Acetaminophen level     Status: Abnormal   Collection Time: 05/19/18  3:19 PM  Result Value Ref Range   Acetaminophen (Tylenol), Serum <10 (L) 10 - 30 ug/mL    Comment: (NOTE) Therapeutic concentrations vary significantly. A range of 10-30 ug/mL  may be an effective concentration for many patients. However, some  are best treated at concentrations outside of this range. Acetaminophen concentrations >150 ug/mL at 4 hours after ingestion  and >50 ug/mL at 12 hours after ingestion are often associated with  toxic reactions. Performed at [facility], [address] [place], [place] [address]   cbc     Status: None   Collection Time: 05/19/18  3:19 PM  Result Value Ref Range   WBC 8.2 4.0 - 10.5 K/uL   RBC 4.38 3.87 - 5.11 MIL/uL   Hemoglobin 13.8 12.0 - 15.0 g/dL   HCT 41.7 36.0 - 46.0 %   MCV 95.2 78.0 - 100.0 fL   MCH 31.5 26.0 - 34.0 pg   MCHC 33.1 30.0 - 36.0 g/dL   RDW 12.3 11.5 - 15.5 %   Platelets 343 150 - 400 K/uL    Comment: Performed at [facility], [address] [place], [place] [address]   Rapid  urine drug screen (hospital performed)     Status: Abnormal   Collection Time: 05/19/18  4:00 PM  Result Value Ref Range   Opiates NONE DETECTED NONE DETECTED   Cocaine NONE DETECTED NONE DETECTED   Benzodiazepines NONE DETECTED NONE DETECTED   Amphetamines NONE DETECTED NONE DETECTED   Tetrahydrocannabinol POSITIVE (A) NONE DETECTED   Barbiturates NONE DETECTED NONE DETECTED    Comment: (NOTE) DRUG SCREEN FOR MEDICAL PURPOSES ONLY.  IF CONFIRMATION IS NEEDED FOR ANY PURPOSE, NOTIFY LAB WITHIN 5 DAYS. LOWEST DETECTABLE LIMITS FOR URINE DRUG SCREEN Drug Class                     Cutoff (ng/mL) Amphetamine and metabolites    1000 Barbiturate and metabolites    200 Benzodiazepine                 017 Tricyclics and metabolites     300 Opiates and metabolites        300 Cocaine and metabolites        300 THC                            50 Performed at [facility], [address] [place], [place] [address]   Pregnancy, urine     Status: None   Collection Time: 05/19/18  4:00 PM  Result Value Ref Range   Preg Test, Ur NEGATIVE NEGATIVE    Comment:        THE SENSITIVITY OF THIS METHODOLOGY IS >20 mIU/mL. Performed at [facility], [address] [place], [place] [address]   Urinalysis, Routine w reflex microscopic     Status: Abnormal   Collection Time: 05/19/18  4:00 PM  Result Value Ref Range   Color, Urine YELLOW YELLOW   APPearance CLOUDY (A) CLEAR   Specific Gravity, Urine 1.020 1.005 - 1.030   pH 6.5 5.0 - 8.0   Glucose, UA NEGATIVE NEGATIVE mg/dL   Hgb urine dipstick TRACE (A) NEGATIVE   Bilirubin Urine NEGATIVE NEGATIVE   Ketones, ur NEGATIVE NEGATIVE mg/dL   Protein, ur NEGATIVE NEGATIVE mg/dL   Nitrite POSITIVE (A) NEGATIVE   Leukocytes, UA SMALL (A) NEGATIVE    Comment: Performed at [facility], [address] [place], [place] [address]  Urinalysis, Microscopic (reflex)     Status: Abnormal   Collection Time:  05/19/18  4:00 PM  Result Value Ref Range   RBC / HPF 0-5 0 - 5 RBC/hpf   WBC, UA 21-50 0 - 5 WBC/hpf   Bacteria, UA MANY (A) NONE SEEN   Squamous Epithelial / LPF 11-20 0 - 5    Comment: Performed at [facility], [address] [place], [place] [address]    Blood Alcohol level:  Lab Results  Component Value Date   [facility] <10 43/15/4008    Metabolic Disorder Labs:  No results found for: HGBA1C, MPG No results found for: PROLACTIN No results found for: CHOL, TRIG, HDL, CHOLHDL, VLDL, LDLCALC  Current Medications: Current Facility-Administered Medications  Medication Dose Route Frequency Provider Last Rate Last Dose  . acetaminophen (TYLENOL) tablet 650 mg  650 mg Oral Q6H PRN Clapacs, Madie Reno, MD   650 mg at 05/20/18 0840  . alum & mag hydroxide-simeth (MAALOX/MYLANTA) 200-200-20 MG/5ML suspension 30 mL  30 mL Oral Q4H PRN Clapacs, Madie Reno, MD      .  fluvoxaMINE (LUVOX) tablet 50 mg  50 mg Oral QHS Evi Mccomb B, MD      . hydrOXYzine (ATARAX/VISTARIL) tablet 50 mg  50 mg Oral TID PRN Clapacs, Madie Reno, MD   50 mg at 05/20/18 0839  . lamoTRIgine (LAMICTAL) tablet 25 mg  25 mg Oral QHS Anjani Feuerborn B, MD      . magnesium hydroxide (MILK OF MAGNESIA) suspension 30 mL  30 mL Oral Daily PRN Clapacs, John T, MD      . nicotine (NICODERM CQ - dosed in mg/24 hours) patch 21 mg  21 mg Transdermal Daily Preet Perrier B, MD   21 mg at 05/20/18 0834  . prazosin (MINIPRESS) capsule 2 mg  2 mg Oral Once Trajan [place] B, MD      . zolpidem (AMBIEN) tablet 5 mg  5 mg Oral QHS PRN Paisley Grajeda B, MD       PTA Medications: Medications Prior to Admission  Medication Sig Dispense Refill Last Dose  . ibuprofen (ADVIL,MOTRIN) 800 MG tablet Take 1 tablet (800 mg total) by mouth every 8 (eight) hours as needed. 21 tablet 0 05/18/2018 at 2000  . LORazepam (ATIVAN) 1 MG tablet Take 1 tablet (1 mg total) by mouth every 8 (eight) hours as needed for anxiety. 10 tablet 0  Unknown at Unknown time  . meclizine (ANTIVERT) 25 MG tablet Take 1 tablet (25 mg total) by mouth 3 (three) times daily as needed for dizziness. 30 tablet 0 Unknown at Unknown time  . ondansetron (ZOFRAN ODT) 4 MG disintegrating tablet Take 1 tablet (4 mg total) by mouth every 8 (eight) hours as needed for nausea or vomiting. 20 tablet 0 Unknown at Unknown time    Musculoskeletal: Strength & Muscle Tone: within normal limits Gait & Station: normal Patient leans: N/A  Psychiatric Specialty Exam: Physical Exam  Nursing note and vitals reviewed. Constitutional: She is oriented to person, place, and time. She appears well-developed and well-nourished.  HENT:  Head: Normocephalic and atraumatic.  Eyes: Pupils are equal, round, and reactive to light. Conjunctivae and EOM are normal.  Neck: Normal range of motion. Neck supple.  Cardiovascular: Normal rate, regular rhythm and normal heart sounds.  Respiratory: Effort normal and breath sounds normal.  GI: Soft. Bowel sounds are normal.  Musculoskeletal: Normal range of motion.  Neurological: She is alert and oriented to person, place, and time.  Skin: Skin is warm and dry.  Psychiatric: Her speech is normal. Her mood appears anxious. Her affect is blunt. She is withdrawn. Cognition and memory are normal. She expresses impulsivity. She exhibits a depressed mood. She expresses suicidal ideation. She expresses suicidal plans.    Review of Systems  Neurological: Negative.   Psychiatric/Behavioral: Positive for depression and suicidal ideas. The patient is nervous/anxious and has insomnia.   All other systems reviewed and are negative.   Blood pressure 126/86, pulse (!) 49, temperature 98 F (36.7 C), temperature source Oral, resp. rate 16, height _0  (1.575 m), weight 52.2 kg, SpO2 100 %.Body mass index is 21.03 kg/m.  See SRA                                                  Sleep:  Number of Hours: 1.5    Treatment  Plan Summary: Daily contact with patient to assess and evaluate symptoms and progress  in treatment and Medication management   Ms. Lovejoy is a 32 year old female with a history of depression and severe anxiety admitted for aborted suicide attempt by overdose.  #Suicidal ideation -patient able to contract for safety  #Mood -start Luvox 50 mg nightly for depression, anxiety -start Lamictal 25 mf for mood stabilization  #Anxiety -give 2 mg Minipress for PTSD -monitor BP  #Insomnia -Ambien 5 mg  #Smoking cessation -Nicotine patch is available  #Labs -lipid panel, TSH, A1C -EKG -pregnancy test  #Disposition -discharge with family -follow up with [facility]   Observation Level/Precautions:  15 minute checks  Laboratory:  CBC Chemistry Profile HbAIC UDS UA  Psychotherapy:    Medications:    Consultations:    Discharge Concerns:    Estimated LOS:  Other:     Physician Treatment Plan for Primary Diagnosis: Severe recurrent major depression without psychotic features ([place]) Long Term Goal(s): Improvement in symptoms so as ready for discharge  Short Term Goals: Ability to identify changes in lifestyle to reduce recurrence of condition will improve, Ability to verbalize feelings will improve, Ability to disclose and discuss suicidal ideas, Ability to demonstrate self-control will improve, Ability to identify and develop effective coping behaviors will improve, Ability to maintain clinical measurements within normal limits will improve, Compliance with prescribed medications will improve and Ability to identify triggers associated with substance abuse/mental health issues will improve  Physician Treatment Plan for Secondary Diagnosis: Principal Problem:   Severe recurrent major depression without psychotic features ([place]) Active Problems:   Cannabis use disorder, moderate, dependence ([facility])   Suicidal ideation   UTI (urinary tract infection)   Tobacco use disorder   OCD  (obsessive compulsive disorder)   PTSD (post-traumatic stress disorder)  Long Term Goal(s): Improvement in symptoms so as ready for discharge  Short Term Goals: NA  I certify that inpatient services furnished can reasonably be expected to improve the patient's condition.    Orson Slick, MD 9/11/20194:12 PM

## 2018-05-20 NOTE — Plan of Care (Addendum)
Problem: Safety: Goal: Ability to disclose and discuss suicidal ideas will improve Outcome: Progressing   Problem: Pain Managment: Goal: General experience of comfort will improve Outcome: Progressing D: Pt A & O X 3. Visible in milieu on initial approach. Presents guarded with flat affect, avertive eye contact and depressed mood. Denies SI, HI and AVH, rates her pain 5/10 (abdomen). Tearful on interaction during assessment "today is my son's first day of school and I'm not there, he has Autism (Asperger's) and I've always been there for my children, I quit about 7 jobs because of my son and social service wants me to go to [place] and other far away places to have him officially diagnosed for therapy". Reports poor sleep, poor appetite, normal energy and good concentration level. Rates his depression, hopelessness and anxiety all 5/10. Pt's goal for today "figure out why I'm here, help me see it as a good thing". A: Emotional support and availability provided to pt as needed. Encouraged pt to voice concerns, attend to ADLS and comply with current treatment regimen including groups. Scheduled and PRN medication administered as ordered with verbal education and effects monitored. Safety checks maintained without self harm gestures or outburst to note thus far.  R: Pt has been medication compliant. Denies adverse drug reactions. Pt did not attend groups as encouraged. POC continues for safety and mood stability.

## 2018-05-20 NOTE — Plan of Care (Signed)
New admit, presenting with suicide ideations and increased stress, safety measures is  in place and monitored routinely, patient is progressing nicely with care plan.

## 2018-05-20 NOTE — Progress Notes (Signed)
New admit from [facility], who is voluntary committed by her mother and  to our unit, present with increase anxiety and suicide ideation, stress from home, and  financial difficulties. Patient reported dumping out all of her medications and flushing them down the toilet, stating that " I see myself doing it and acting it out.I saw it" and felt those thoughts were too much to handle. Patient states "life has really been stressful, I have been in dark place., patient was reassured and encouraged to participate in treatment regimen and follow unit guide lines for safety and expected behaviors, beverages and hygiene products was provided , room orientation is complete. Room with eye sight for frequent monitoring. Two nurse body search done, and skin check done, no contraband found, skin is clean no issues. 15 minute monitoring in progress.

## 2018-05-20 NOTE — Progress Notes (Signed)
Recreation Therapy Notes  INPATIENT RECREATION THERAPY ASSESSMENT  Patient Details Name: Alicia Brown MRN: [id number] DOB: 1986-06-20 Today's Date: 05/20/2018       Information Obtained From: Patient  Able to Participate in Assessment/Interview: Yes  Patient Presentation: Responsive  Reason for Admission (Per Patient): Active Symptoms, Suicidal Ideation  Patient Stressors:    Coping Skills:   Write, Deep Breathing, Other (Comment)(walk)  Leisure Interests (2+):  Music - Listen  Frequency of Recreation/Participation: Monthly  Awareness of Community Resources:  Yes  Community Resources:  Park  Current Use:    If no, Barriers?:    Expressed Interest in [employer] Information:    [place] of Residence:  [place]  Patient Main Form of Transportation: Therapist, music  Patient Strengths:  Walking away  Patient Identified Areas of Improvement:  Getting to know me  Patient Goal for Hospitalization:  Strengthing my coping skills  Current SI (including self-harm):  No  Current HI:  No  Current AVH: No  Staff Intervention Plan: Group Attendance, Collaborate with Interdisciplinary Treatment Team  Consent to Intern Participation: N/A  Dianelys Scinto 05/20/2018, 4:04 PM

## 2018-05-20 NOTE — Progress Notes (Signed)
Recreation Therapy Notes   Date: 05/20/2018  Time: 9:30 pm   Location: Craft Room   Behavioral response: N/A   Intervention Topic: Stress  Discussion/Intervention: Patient did not attend group.   Clinical Observations/Feedback:  Patient did not attend group.   [facility] Golla LRT/CTRS         Sherea Liptak 05/20/2018 11:47 AM 
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
## 2018-05-20 NOTE — Tx Team (Addendum)
Interdisciplinary Treatment and Diagnostic Plan Update  05/20/2018 Time of Session: 10:30am [name] MRN: [id number]  Principal Diagnosis: Severe recurrent major depression without psychotic features [facility])  Secondary Diagnoses: Principal Problem:   Severe recurrent major depression without psychotic features ([place]) Active Problems:   Cannabis use disorder, moderate, dependence ([place])   Suicidal ideation   UTI (urinary tract infection)   Tobacco use disorder   Current Medications:  Current Facility-Administered Medications  Medication Dose Route Frequency Provider Last Rate Last Dose  . acetaminophen (TYLENOL) tablet 650 mg  650 mg Oral Q6H PRN Clapacs, Madie Reno, MD   650 mg at 05/20/18 0840  . alum & mag hydroxide-simeth (MAALOX/MYLANTA) 200-200-20 MG/5ML suspension 30 mL  30 mL Oral Q4H PRN Clapacs, John T, MD      . hydrOXYzine (ATARAX/VISTARIL) tablet 50 mg  50 mg Oral TID PRN Clapacs, Madie Reno, MD   50 mg at 05/20/18 0839  . magnesium hydroxide (MILK OF MAGNESIA) suspension 30 mL  30 mL Oral Daily PRN Clapacs, John T, MD      . nicotine (NICODERM CQ - dosed in mg/24 hours) patch 21 mg  21 mg Transdermal Daily Pucilowska, Jolanta B, MD   21 mg at 05/20/18 0834  . traZODone (DESYREL) tablet 100 mg  100 mg Oral QHS PRN Clapacs, Madie Reno, MD       PTA Medications: Medications Prior to Admission  Medication Sig Dispense Refill Last Dose  . ibuprofen (ADVIL,MOTRIN) 800 MG tablet Take 1 tablet (800 mg total) by mouth every 8 (eight) hours as needed. 21 tablet 0 05/18/2018 at 2000  . LORazepam (ATIVAN) 1 MG tablet Take 1 tablet (1 mg total) by mouth every 8 (eight) hours as needed for anxiety. 10 tablet 0 Unknown at Unknown time  . meclizine (ANTIVERT) 25 MG tablet Take 1 tablet (25 mg total) by mouth 3 (three) times daily as needed for dizziness. 30 tablet 0 Unknown at Unknown time  . ondansetron (ZOFRAN ODT) 4 MG disintegrating tablet Take 1 tablet (4 mg total) by mouth every 8 (eight)  hours as needed for nausea or vomiting. 20 tablet 0 Unknown at Unknown time    Patient Stressors: Financial difficulties Medication change or noncompliance Substance abuse  Patient Strengths: Capable of independent living Motivation for treatment/growth Supportive family/friends  Treatment Modalities: Medication Management, Group therapy, Case management,  1 to 1 session with clinician, Psychoeducation, Recreational therapy.   Physician Treatment Plan for Primary Diagnosis: Severe recurrent major depression without psychotic features ([place]) Long Term Goal(s):     Short Term Goals:    Medication Management: Evaluate patient's response, side effects, and tolerance of medication regimen.  Therapeutic Interventions: 1 to 1 sessions, Unit Group sessions and Medication administration.  Evaluation of Outcomes: Progressing  Physician Treatment Plan for Secondary Diagnosis: Principal Problem:   Severe recurrent major depression without psychotic features ([place]) Active Problems:   Cannabis use disorder, moderate, dependence ([facility])   Suicidal ideation   UTI (urinary tract infection)   Tobacco use disorder  Long Term Goal(s):     Short Term Goals:       Medication Management: Evaluate patient's response, side effects, and tolerance of medication regimen.  Therapeutic Interventions: 1 to 1 sessions, Unit Group sessions and Medication administration.  Evaluation of Outcomes: Progressing   RN Treatment Plan for Primary Diagnosis: Severe recurrent major depression without psychotic features ([place]) Long Term Goal(s): Knowledge of disease and therapeutic regimen to maintain health will improve  Short Term Goals: Ability  to verbalize feelings will improve, Ability to identify and develop effective coping behaviors will improve and Compliance with prescribed medications will improve  Medication Management: RN will administer medications as ordered by provider, will assess and evaluate  patient's response and provide education to patient for prescribed medication. RN will report any adverse and/or side effects to prescribing provider.  Therapeutic Interventions: 1 on 1 counseling sessions, Psychoeducation, Medication administration, Evaluate responses to treatment, Monitor vital signs and CBGs as ordered, Perform/monitor CIWA, COWS, AIMS and Fall Risk screenings as ordered, Perform wound care treatments as ordered.  Evaluation of Outcomes: Progressing   LCSW Treatment Plan for Primary Diagnosis: Severe recurrent major depression without psychotic features ([place]) Long Term Goal(s): Safe transition to appropriate next level of care at discharge, Engage patient in therapeutic group addressing interpersonal concerns.  Short Term Goals: Engage patient in aftercare planning with referrals and resources, Increase ability to appropriately verbalize feelings, Identify triggers associated with mental health/substance abuse issues and Increase skills for wellness and recovery  Therapeutic Interventions: Assess for all discharge needs, 1 to 1 time with Social worker, Explore available resources and support systems, Assess for adequacy in community support network, Educate family and significant other(s) on suicide prevention, Complete Psychosocial Assessment, Interpersonal group therapy.  Evaluation of Outcomes: Progressing   Progress in Treatment: Attending groups: No. Participating in groups: No. Taking medication as prescribed: Yes. Toleration medication: Yes. Family/Significant other contact made: No, will contact:  Patient refused family contact Patient understands diagnosis: Yes. Discussing patient identified problems/goals with staff: Yes. Medical problems stabilized or resolved: Yes. Denies suicidal/homicidal ideation: Yes. Issues/concerns per patient self-inventory: Yes. Other:   New problem(s) identified: No, Describe:  None  New Short Term/Long Term Goal(s): "Coping  Skills, trying to find the positive in even being here."  Patient Goals:  "Coping Skills, trying to find the positive in even being here."  Discharge Plan or Barriers: To return home and follow out with outpatient medication management and therapy.   Reason for Continuation of Hospitalization: Depression Medication stabilization  Estimated Length of Stay: 5-7 days  Recreational Therapy: Patient Stressors: N/A Patient Goal: Patient will engage in groups without prompting or encouragement from LRT x3 group sessions within 5 recreation therapy group sessions  Attendees: Patient: Alicia Brown 05/20/2018 11:12 AM  Physician: Dr. Bary Leriche, MD 05/20/2018 11:12 AM  Nursing: Polly Cobia, RN 05/20/2018 11:12 AM  RN Care Manager: 05/20/2018 11:12 AM  Social Worker: Darin Engels, [place] 05/20/2018 11:12 AM  Recreational Therapist: Reuel Boom, [place] 05/20/2018 11:12 AM  Other: Dossie Arbour, LCSW 05/20/2018 11:12 AM  Other:  05/20/2018 11:12 AM  Other: 05/20/2018 11:12 AM    Scribe for Treatment Team: Darin Engels, LCSW 05/20/2018 11:12 AM

## 2018-05-20 NOTE — BHH Suicide Risk Assessment (Signed)
[facility] INPATIENT:  Family/Significant Other Suicide Prevention Education  Suicide Prevention Education:  Patient Refusal for Family/Significant Other Suicide Prevention Education: The patient Alicia Brown has refused to provide written consent for family/significant other to be provided Family/Significant Other Suicide Prevention Education during admission and/or prior to discharge.  Physician notified.  Johny Shears 05/20/2018, 10:14 AM

## 2018-05-20 NOTE — BH Assessment (Addendum)
TTS received confirmation from pt's nurse-Latisha that voluntary consent form was signed and faxed to [facility].  Confirmed with registration, pt pre-admitted

## 2018-05-21 MED ORDER — PRAZOSIN HCL 2 MG PO CAPS
2.0000 mg | ORAL_CAPSULE | Freq: Two times a day (BID) | ORAL | Status: DC
  Administered 2018-05-21 – 2018-05-25 (×8): 2 mg via ORAL
  Filled 2018-05-21 (×8): qty 1

## 2018-05-21 MED ORDER — FLUVOXAMINE MALEATE 50 MG PO TABS
100.0000 mg | ORAL_TABLET | Freq: Every day | ORAL | Status: DC
  Administered 2018-05-21 – 2018-05-24 (×4): 100 mg via ORAL
  Filled 2018-05-21 (×4): qty 2

## 2018-05-21 MED ORDER — FOSFOMYCIN TROMETHAMINE 3 G PO PACK
3.0000 g | PACK | Freq: Once | ORAL | Status: AC
  Administered 2018-05-21: 3 g via ORAL
  Filled 2018-05-21: qty 3

## 2018-05-21 NOTE — BHH Group Notes (Signed)
LCSW Group Therapy Note  05/21/2018 1:00 pm  Type of Therapy/Topic:  Group Therapy:  Balance in Life  Participation Level:  Active  Description of Group:    This group will address the concept of balance and how it feels and looks when one is unbalanced. Patients will be encouraged to process areas in their lives that are out of balance and identify reasons for remaining unbalanced. Facilitators will guide patients in utilizing problem-solving interventions to address and correct the stressor making their life unbalanced. Understanding and applying boundaries will be explored and addressed for obtaining and maintaining a balanced life. Patients will be encouraged to explore ways to assertively make their unbalanced needs known to significant others in their lives, using other group members and facilitator for support and feedback.  Therapeutic Goals: 1. Patient will identify two or more emotions or situations they have that consume much of in their lives. 2. Patient will identify signs/triggers that life has become out of balance:  3. Patient will identify two ways to set boundaries in order to achieve balance in their lives:  4. Patient will demonstrate ability to communicate their needs through discussion and/or role plays  Summary of Patient Progress: Alicia Brown actively participated in today's group discussion on balance in life.  Alicia Brown shared that anxiety has consumed much of her life.  Alicia Brown shared that anxiety is what has caused her life to become out of balance and she not to be able to function well.  Alicia Brown shared "this is why I'm here in the hospital".  Alicia Brown shared that her kids help her to achieve more balance in her life.  She shared that she must "keep it together for them".       Therapeutic Modalities:   Cognitive Behavioral Therapy Solution-Focused Therapy Assertiveness Training  Alease FrameSonya S Avyukt Cimo, [place]CS[place] 05/21/2018 2:34 PM

## 2018-05-21 NOTE — Progress Notes (Signed)
Patient was seen in the milieu, sad and depressed but cooperative and compliant with treatment. Expressing hopelessness but contracts for safety. Was reserved and did not want to discuss her situation in details. Was seen in evening activities, interacting with staff and peers appropriately. Presented to the medication room to receive her bedtime medications. Went to bed and slept all night.  0700: patient's mother called to inform staff that she is the primary support for the patient, that she should be called as needed.

## 2018-05-21 NOTE — Progress Notes (Signed)
Recreation Therapy Notes  Date: 05/21/2018  Time: 9:30 am  Location: Craft Room  Behavioral response: Appropriate    Intervention Topic: Creative expressions  Discussion/Intervention:  Group content on today was focused on creative expressions. The group defined creative expressions and ways they use creative expressions. Individual identified other positive ways creative expressions can be used and why it is important to express yourself. Patients participated in the intervention "expressive painting", where they had a chance to creatively express themselves.   Clinical Observations/Feedback:  Patient came to group late due to unknown reasons. Individual was social with peers and staff while participating in the intervention.  Guiselle Mian LRT/CTRS         Kateri Balch 05/21/2018 11:26 AM

## 2018-05-21 NOTE — Progress Notes (Signed)
[facility] MD Progress Note  05/22/2018 5:10 PM Alicia Brown  MRN:  [id number]  Subjective:   Alicia Brown has a lifelong history of severe untreated anxiety. She came to the hospital after aborted suicide attempt by medication overdose. She had 200 pills she wanted to swallow but she flushed them down the toilet. She is still depressed and anxious.  Alicia Brown is out in the day room interacting with peers. It is "hard" but she is trying Alicia best. She is calm today and was able to participate in group this morning. Still sad and reflective. She slept well last night but does not complain of daytime somnolence. No panic attacks today. She wants to continue current regimen.  Yesterday, she became rater irrate with Alicia Brown when she has learned that Alicia Brown informed Alicia Brown, ages 26 and 25 one with Asperger's about the circumstances of Alicia hospitalization. She felt that this was Alicia job. She had to explain toher son why she has "the head problem". She did not want to see Alicia Brown last night. She is ready to receive Alicia tonight "if she brings my sister who is a buffer".   Principal Problem: Severe recurrent major depression without psychotic features (HCC) Diagnosis:   Patient Active Problem List   Diagnosis Date Noted  . Severe recurrent major depression without psychotic features (HCC) [F33.2] 05/20/2018    Priority: High  . Cannabis use disorder, moderate, dependence (HCC) [F12.20] 05/20/2018  . Suicidal ideation [R45.851] 05/20/2018  . UTI (urinary tract infection) [N39.0] 05/20/2018  . Tobacco use disorder [F17.200] 05/20/2018  . OCD (obsessive compulsive disorder) [F42.9] 05/20/2018  . PTSD (post-traumatic stress disorder) [F43.10] 05/20/2018   Total Time spent with patient: 20 minutes  Past Psychiatric History: anxiety, depression, mood instability  Past Medical History:  Past Medical History:  Diagnosis Date  . Anxiety   . Ovarian cyst   . Vertigo     Past Surgical  History:  Procedure Laterality Date  . APPENDECTOMY    . MYOMECTOMY    . TUBAL LIGATION     Family History:  Family History  Problem Relation Age of Onset  . Cancer Other   . Heart failure Other   . Diabetes Other    Family Psychiatric  History: anxiety, OCD Social History:  Social History   Substance and Sexual Activity  Alcohol Use Not Currently     Social History   Substance and Sexual Activity  Drug Use Yes  . Types: Marijuana    Social History   Socioeconomic History  . Marital status: Single    Spouse name: Not on file  . Number of Brown: Not on file  . Years of education: Not on file  . Highest education level: Not on file  Occupational History  . Not on file  Social Needs  . Financial resource strain: Not on file  . Food insecurity:    Worry: Not on file    Inability: Not on file  . Transportation needs:    Medical: Not on file    Non-medical: Not on file  Tobacco Use  . Smoking status: Current Some Day Smoker    Packs/day: 0.25    Types: Cigarettes  . Smokeless tobacco: Never Used  Substance and Sexual Activity  . Alcohol use: Not Currently  . Drug use: Yes    Types: Marijuana  . Sexual activity: Yes    Birth control/protection: Surgical  Lifestyle  . Physical activity:    Days per week:  Not on file    Minutes per session: Not on file  . Stress: Not on file  Relationships  . Social connections:    Talks on phone: Not on file    Gets together: Not on file    Attends religious service: Not on file    Active member of club or organization: Not on file    Attends meetings of clubs or organizations: Not on file    Relationship status: Not on file  Other Topics Concern  . Not on file  Social History Narrative  . Not on file   Additional Social History:                         Sleep: Fair  Appetite:  Fair  Current Medications: Current Facility-Administered Medications  Medication Dose Route Frequency Provider Last Rate  Last Dose  . acetaminophen (TYLENOL) tablet 650 mg  650 mg Oral Q6H PRN Clapacs, Jackquline Denmark, MD   650 mg at 05/20/18 0840  . alum & mag hydroxide-simeth (MAALOX/MYLANTA) 200-200-20 MG/5ML suspension 30 mL  30 mL Oral Q4H PRN Clapacs, John T, MD      . fluvoxaMINE (LUVOX) tablet 100 mg  100 mg Oral QHS Izaha Shughart B, MD   100 mg at 05/21/18 2216  . hydrOXYzine (ATARAX/VISTARIL) tablet 50 mg  50 mg Oral TID PRN Clapacs, Jackquline Denmark, MD   50 mg at 05/21/18 0848  . lamoTRIgine (LAMICTAL) tablet 25 mg  25 mg Oral QHS Wendall Isabell B, MD   25 mg at 05/21/18 2246  . magnesium hydroxide (MILK OF MAGNESIA) suspension 30 mL  30 mL Oral Daily PRN Clapacs, John T, MD      . nicotine (NICODERM CQ - dosed in mg/24 hours) patch 21 mg  21 mg Transdermal Daily Shene Maxfield B, MD   21 mg at 05/22/18 1033  . prazosin (MINIPRESS) capsule 2 mg  2 mg Oral BID Garth Diffley B, MD   2 mg at 05/22/18 0850  . zolpidem (AMBIEN) tablet 5 mg  5 mg Oral QHS PRN Kiam Bransfield B, MD   5 mg at 05/21/18 2216    Lab Results: No results found for this or any previous visit (from the past 48 hour(s)).  Blood Alcohol level:  Lab Results  Component Value Date   ETH <10 05/19/2018    Metabolic Disorder Labs: No results found for: HGBA1C, MPG No results found for: PROLACTIN No results found for: CHOL, TRIG, HDL, CHOLHDL, VLDL, LDLCALC  Physical Findings: AIMS: Facial and Oral Movements Muscles of Facial Expression: None, normal Lips and Perioral Area: None, normal Jaw: None, normal Tongue: None, normal,Extremity Movements Upper (arms, wrists, hands, fingers): None, normal Lower (legs, knees, ankles, toes): None, normal, Trunk Movements Neck, shoulders, hips: None, normal, Overall Severity Severity of abnormal movements (highest score from questions above): None, normal Incapacitation due to abnormal movements: None, normal Patient's awareness of abnormal movements (rate only patient's report): No  Awareness, Dental Status Current problems with teeth and/or dentures?: No Does patient usually wear dentures?: No  CIWA:  CIWA-Ar Total: 2 COWS:  COWS Total Score: 2  Musculoskeletal: Strength & Muscle Tone: within normal limits Gait & Station: normal Patient leans: N/A  Psychiatric Specialty Exam: Physical Exam  Nursing note and vitals reviewed. Psychiatric: Alicia speech is normal. Thought content normal. Alicia mood appears anxious. She is withdrawn. Cognition and memory are normal. She expresses impulsivity. She exhibits a depressed mood.  Review of Systems  Neurological: Negative.   Psychiatric/Behavioral: Positive for depression. The patient is nervous/anxious.   All other systems reviewed and are negative.   Blood pressure 123/88, pulse 97, temperature 98.6 F (37 C), temperature source Oral, resp. rate 18, height 5\' 2"  (1.575 m), weight 52.2 kg, SpO2 100 %.Body mass index is 21.03 kg/m.  General Appearance: Casual  Eye Contact:  Good  Speech:  Clear and Coherent  Volume:  Decreased  Mood:  Anxious and Depressed  Affect:  Tearful  Thought Process:  Goal Directed and Descriptions of Associations: Intact  Orientation:  Full (Time, Place, and Person)  Thought Content:  WDL  Suicidal Thoughts:  No  Homicidal Thoughts:  No  Memory:  Immediate;   Fair Recent;   Fair Remote;   Fair  Judgement:  Impaired  Insight:  Present  Psychomotor Activity:  Psychomotor Retardation  Concentration:  Concentration: Fair and Attention Span: Fair  Recall:  FiservFair  Fund of Knowledge:  Fair  Language:  Fair  Akathisia:  No  Handed:  Right  AIMS (if indicated):     Assets:  Communication Skills Desire for Improvement Financial Resources/Insurance Housing Physical Health Resilience Social Support  ADL's:  Intact  Cognition:  WNL  Sleep:  Number of Hours: 7     Treatment Plan Summary: Daily contact with patient to assess and evaluate symptoms and progress in treatment and Medication  management   Ms. Earlene PlaterWallace is a 32 year old female with a history of depression and severe anxiety admitted for aborted suicide attempt by overdose. She tolerates medications well.  #Suicidal ideation, resolved -patient able to contract for safety in the hospital  #Mood, still depressed -increase Luvox to 100 mg nightly for depression, anxiety -continue Lamictal 25 mg for mood stabilization  #Anxiety, no major panic attacks -continue Minipress 2 mg BID for PTSD - BP stable  #Insomnia, improved with treatment -change Ambien 5 mg nightly to PRN  #UTI -received a single dose of Manurol  #Cannabis use disorder -minimizes problems and declines treatment  #Smoking cessation -Nicotine patch is available  #Labs -pregnancy test is negative  #Disposition -discharge with family -[facility]  Kristine LineaJolanta Paizlie Klaus, MD 05/22/2018, 5:10 PM

## 2018-05-21 NOTE — Progress Notes (Addendum)
[facility] MD Progress Note  05/21/2018 4:38 PM Alicia Brown  MRN:  [id number]  Subjective:    Alicia Brown had a major panic attack this morning but responded well to a dose of Vistaril, was able to calm herself down and participated in programing. She seems to tolerate medications well so far. No excessive sedation fro Luvox or blood pressure drop from Minipress. She spoke with her mother and fiance who were not aware of her suicidal thoughts. They are supportive. The mother will be here tonight, the fiance is a truck driver and the patient asked him to to change his route until she leaves here. She has been practicing her best coping skills here.  Principal Problem: Severe recurrent major depression without psychotic features (HCC) Diagnosis:   Patient Active Problem List   Diagnosis Date Noted  . Severe recurrent major depression without psychotic features (HCC) [F33.2] 05/20/2018    Priority: High  . Cannabis use disorder, moderate, dependence (HCC) [F12.20] 05/20/2018  . Suicidal ideation [R45.851] 05/20/2018  . UTI (urinary tract infection) [N39.0] 05/20/2018  . Tobacco use disorder [F17.200] 05/20/2018  . OCD (obsessive compulsive disorder) [F42.9] 05/20/2018  . PTSD (post-traumatic stress disorder) [F43.10] 05/20/2018   Total Time spent with patient: 20 minutes  Past Psychiatric History: depression, anxiety.  Past Medical History:  Past Medical History:  Diagnosis Date  . Anxiety   . Ovarian cyst   . Vertigo     Past Surgical History:  Procedure Laterality Date  . APPENDECTOMY    . MYOMECTOMY    . TUBAL LIGATION     Family History:  Family History  Problem Relation Age of Onset  . Cancer Other   . Heart failure Other   . Diabetes Other    Family Psychiatric  History: anxiety, son with Asperger's Social History:  Social History   Substance and Sexual Activity  Alcohol Use Not Currently     Social History   Substance and Sexual Activity  Drug Use Yes  .  Types: Marijuana    Social History   Socioeconomic History  . Marital status: Single    Spouse name: Not on file  . Number of children: Not on file  . Years of education: Not on file  . Highest education level: Not on file  Occupational History  . Not on file  Social Needs  . Financial resource strain: Not on file  . Food insecurity:    Worry: Not on file    Inability: Not on file  . Transportation needs:    Medical: Not on file    Non-medical: Not on file  Tobacco Use  . Smoking status: Current Some Day Smoker    Packs/day: 0.25    Types: Cigarettes  . Smokeless tobacco: Never Used  Substance and Sexual Activity  . Alcohol use: Not Currently  . Drug use: Yes    Types: Marijuana  . Sexual activity: Yes    Birth control/protection: Surgical  Lifestyle  . Physical activity:    Days per week: Not on file    Minutes per session: Not on file  . Stress: Not on file  Relationships  . Social connections:    Talks on phone: Not on file    Gets together: Not on file    Attends religious service: Not on file    Active member of club or organization: Not on file    Attends meetings of clubs or organizations: Not on file    Relationship status: Not on  file  Other Topics Concern  . Not on file  Social History Narrative  . Not on file   Additional Social History:                         Sleep: Fair  Appetite:  Fair  Current Medications: Current Facility-Administered Medications  Medication Dose Route Frequency Provider Last Rate Last Dose  . acetaminophen (TYLENOL) tablet 650 mg  650 mg Oral Q6H PRN Clapacs, Jackquline DenmarkJohn T, MD   650 mg at 05/20/18 0840  . alum & mag hydroxide-simeth (MAALOX/MYLANTA) 200-200-20 MG/5ML suspension 30 mL  30 mL Oral Q4H PRN Clapacs, John T, MD      . fluvoxaMINE (LUVOX) tablet 100 mg  100 mg Oral QHS Montreal Steidle B, MD      . hydrOXYzine (ATARAX/VISTARIL) tablet 50 mg  50 mg Oral TID PRN Clapacs, Jackquline DenmarkJohn T, MD   50 mg at 05/21/18 0848   . lamoTRIgine (LAMICTAL) tablet 25 mg  25 mg Oral QHS Marchetta Navratil B, MD   25 mg at 05/20/18 2128  . magnesium hydroxide (MILK OF MAGNESIA) suspension 30 mL  30 mL Oral Daily PRN Clapacs, John T, MD      . nicotine (NICODERM CQ - dosed in mg/24 hours) patch 21 mg  21 mg Transdermal Daily Owenn Rothermel B, MD   21 mg at 05/21/18 [id number]  . prazosin (MINIPRESS) capsule 2 mg  2 mg Oral BID Zula Hovsepian B, MD      . zolpidem (AMBIEN) tablet 5 mg  5 mg Oral QHS PRN Quinetta Shilling B, MD   5 mg at 05/20/18 2128    Lab Results: No results found for this or any previous visit (from the past 48 hour(s)).  Blood Alcohol level:  Lab Results  Component Value Date   ETH <10 05/19/2018    Metabolic Disorder Labs: No results found for: HGBA1C, MPG No results found for: PROLACTIN No results found for: CHOL, TRIG, HDL, CHOLHDL, VLDL, LDLCALC  Physical Findings: AIMS: Facial and Oral Movements Muscles of Facial Expression: None, normal Lips and Perioral Area: None, normal Jaw: None, normal Tongue: None, normal,Extremity Movements Upper (arms, wrists, hands, fingers): None, normal Lower (legs, knees, ankles, toes): None, normal, Trunk Movements Neck, shoulders, hips: None, normal, Overall Severity Severity of abnormal movements (highest score from questions above): None, normal Incapacitation due to abnormal movements: None, normal Patient's awareness of abnormal movements (rate only patient's report): No Awareness, Dental Status Current problems with teeth and/or dentures?: No Does patient usually wear dentures?: No  CIWA:  CIWA-Ar Total: 2 COWS:  COWS Total Score: 2  Musculoskeletal: Strength & Muscle Tone: within normal limits Gait & Station: normal Patient leans: N/A  Psychiatric Specialty Exam: Physical Exam  Nursing note and vitals reviewed. Psychiatric: Her speech is normal. Her mood appears anxious. Her affect is blunt. She is withdrawn. Cognition and memory  are normal. She expresses impulsivity. She expresses suicidal ideation.    Review of Systems  Neurological: Negative.   Psychiatric/Behavioral: Positive for depression and suicidal ideas. The patient is nervous/anxious.   All other systems reviewed and are negative.   Blood pressure 125/88, pulse 66, temperature 98.7 F (37.1 C), temperature source Oral, resp. rate 18, height 5\' 2"  (1.575 m), weight 52.2 kg, SpO2 100 %.Body mass index is 21.03 kg/m.  General Appearance: Casual  Eye Contact:  Good  Speech:  Clear and Coherent  Volume:  Normal  Mood:  Anxious and Depressed  Affect:  Flat  Thought Process:  Goal Directed and Descriptions of Associations: Intact  Orientation:  Full (Time, Place, and Person)  Thought Content:  WDL  Suicidal Thoughts:  Yes.  without intent/plan  Homicidal Thoughts:  No  Memory:  Immediate;   Fair Recent;   Fair Remote;   Fair  Judgement:  Impaired  Insight:  Present  Psychomotor Activity:  Decreased  Concentration:  Concentration: Fair and Attention Span: Fair  Recall:  [employer] of Knowledge:  Fair  Language:  Fair  Akathisia:  No  Handed:  Right  AIMS (if indicated):     Assets:  Communication Skills Desire for Improvement Financial Resources/Insurance Housing Intimacy Physical Health Resilience Social Support Vocational/Educational  ADL's:  Intact  Cognition:  WNL  Sleep:  Number of Hours: 7     Treatment Plan Summary: Daily contact with patient to assess and evaluate symptoms and progress in treatment and Medication management   Alicia Brown is a 32 year old female with a history of depression and severe anxiety admitted for aborted suicide attempt by overdose.  #Suicidal ideation -patient able to contract for safety  #Mood -increase Luvox to 100 mg nightly for depression, anxiety -continue Lamictal 25 mf for mood stabilization  #Anxiety -start Minipress 2 mg BID for PTSD -monitor BP  #Insomnia -Ambien 5  mg  #UTI -give Manurol  #Smoking cessation -Nicotine patch is available  #Labs -lipid panel, TSH, A1C -EKG -pregnancy test  #Disposition -discharge with family -[facility]  Kristine Linea, MD 05/21/2018, 4:38 PM

## 2018-05-21 NOTE — Plan of Care (Signed)
Patient was anxious and had crying spells x 2 today.Vistaril helped.When patient's child asked her "mom are you sick of your body or your head?".Patient is upset with her mother and patient told mother not to come and visit her today.Patient is receptive with the supportive therapy given.Denies SI,HI and AVH.Compliant with medications.Attended groups.Appetite and energy level good.

## 2018-05-21 NOTE — Plan of Care (Signed)
Anxious and depressed but pleasant and cooperative

## 2018-05-21 NOTE — Progress Notes (Signed)
Patient is more visible in the milieu. Visited with her family and reported that they are supportive. Alert and oriented. Sad and depressed but denying suicidal thoughts and contract for safety. Pleasant on approach. Participated in unit evening  Activities. Has no concerns so far. Staff continue to provide support and encouragements. Safety and security maintained.

## 2018-05-21 NOTE — Plan of Care (Signed)
More visible in the milieu, sad but pleasant and compliant with treatment

## 2018-05-22 NOTE — Progress Notes (Signed)
Patient has stayed active in the milieu. Attended group and remained cooperative. Denying thoughts of self harm. Denying hallucinations. No sign of distress so far. Reports that her mood is improving, that family and staff have been supportive. Staff continue to provide support and encouragements.

## 2018-05-22 NOTE — Plan of Care (Signed)
No sign of distress. Active in the milieu. Compliant with treatment

## 2018-05-22 NOTE — [facility] Group Notes (Signed)
[facility] Group Notes:  (Nursing/MHT/Case Management/Adjunct)  Date:  05/22/2018  Time:  1:38 AM  Type of Therapy:  Group Therapy  Participation Level:  Active  Participation Quality:  Appropriate  Affect:  Appropriate  Cognitive:  Appropriate  Insight:  Appropriate  Engagement in Group:  Engaged  Modes of Intervention:  Discussion  Summary of Progress/Problems: MHT reviewed rules and expectations of the unit. 1. Visitation hours and number of visitors at one time 2. Phone hours 3. Where to go for vitals and the time it would be called for 4. Informed of routine checks and encouraged to cover appropriately as MHT's would be looking in on them at night 5. No sharing clothes or items 6. No touching, hugging, doing other patient's hair 7. No going in other patient's room 8. No walking down halls other than your own 9. No use of last names on unit MHT handed out snack menu and explained how snack would be distributed MHT reminded patients not to take food or drink, other than water to the rooms MHT explained why food was not allowed in rooms MHT was called out to assist with a patient on the unit MHT was unable to review goals with each patient.  Alicia NeighborsKeith D Lataisha Colan 05/22/2018, 1:38 AM

## 2018-05-22 NOTE — Plan of Care (Signed)
Patient is pleasant ,looks anxious,denies any panic attacks today.Patient states that her arms and legs" shake" when she stays still.Patient stated that she did have "this shakes" sometimes at home too.Denies SI,HI and AVH.Attended groups.Compliant with medications.Appetite and energy level good.Support and encouragement given.

## 2018-05-22 NOTE — Progress Notes (Signed)
Recreation Therapy Notes  Date: 05/22/2018  Time: 9:30 am  Location: Craft Room  Behavioral response: Appropriate    Intervention Topic: Leisure  Discussion/Intervention:  Group content today was focused on leisure. The group defined what leisure is and some positive leisure activities they participate in. Individuals identified the difference between good and bad leisure. Participants expressed how they feel after participating in the leisure of their choice. The group discussed how they go about picking a leisure activity and if others are involved in their leisure activities. The patient stated how many leisure activities they too choose from and reasons why it is important to have leisure time. Individuals participated in the intervention "Exploration of Leisure" where they had a chance to identify new leisure activities as well as benefits of leisure.   Clinical Observations/Feedback:  Patient came to group late due to unknown reasons. Individual was social with peers and staff while participating in the intervention.  Carli Lefevers LRT/CTRS          Ramar Nobrega 05/22/2018 12:58 PM

## 2018-05-22 NOTE — [facility] Group Notes (Signed)
05/22/2018 1PM  Type of Therapy and Topic:  Group Therapy:  Feelings around Relapse and Recovery  Participation Level:  Active   Description of Group:    Patients in this group will discuss emotions they experience before and after a relapse. They will process how experiencing these feelings, or avoidance of experiencing them, relates to having a relapse. Facilitator will guide patients to explore emotions they have related to recovery. Patients will be encouraged to process which emotions are more powerful. They will be guided to discuss the emotional reaction significant others in their lives may have to patients' relapse or recovery. Patients will be assisted in exploring ways to respond to the emotions of others without this contributing to a relapse.  Therapeutic Goals: 1. Patient will identify two or more emotions that lead to a relapse for them 2. Patient will identify two emotions that result when they relapse 3. Patient will identify two emotions related to recovery 4. Patient will demonstrate ability to communicate their needs through discussion and/or role plays   Summary of Patient Progress: Actively and appropriately engaged in the group. Patient was able to provide support and validation to other group members.Patient practiced active listening when interacting with the facilitator and other group members. Patient says "I broke. Life was out of control. Things got worse" as reasons of her relapse. She reports not engaging in self-care and was only focused on taking care of her children. Patient participated in the recovery focused game. Patient is still in the process of obtaining treatment goals.      Therapeutic Modalities:   Cognitive Behavioral Therapy Solution-Focused Therapy Assertiveness Training Relapse Prevention Therapy   Johny ShearsCassandra  Rommel Hogston, Alexander MtLCSW 05/22/2018 2:44 PM

## 2018-05-23 DIAGNOSIS — N390 Urinary tract infection, site not specified: Secondary | ICD-10-CM

## 2018-05-23 DIAGNOSIS — G4700 Insomnia, unspecified: Secondary | ICD-10-CM

## 2018-05-23 DIAGNOSIS — F419 Anxiety disorder, unspecified: Secondary | ICD-10-CM

## 2018-05-23 DIAGNOSIS — F1290 Cannabis use, unspecified, uncomplicated: Secondary | ICD-10-CM

## 2018-05-23 DIAGNOSIS — F17210 Nicotine dependence, cigarettes, uncomplicated: Secondary | ICD-10-CM

## 2018-05-23 NOTE — [facility] Group Notes (Signed)
LCSW Group Therapy Note  05/23/2018 1:15pm  Type of Therapy and Topic:  Group Therapy:  Healthy Self Image and Positive Change  Participation Level:  Active   Description of Group:  In this group, patients will compare and contrast their current "I am...." statements to the visions they identify as desirable for their lives.  Patients discuss fears and how they can make positive changes in their cognitions that will positively impact their behaviors.  Facilitator played a motivational 3-minute speech and patients were left with the task of thinking about what "I am...." statements they can start using in their lives immediately.  Therapeutic Goals: 1. Patient will state their current self-perception as expressed in an "I Am" statement 2. Patient will contrast this with their desired vision for their live 3. Patient will identify 3 fears that negatively impact their behavior 4. Patient will discuss cognitive distortions that stem from their fears 5. Patient will verbalize statements that challenge their cognitive distortions  Summary of Patient Progress:The patient stated, "I am a survivor." Patient discussed her fears and how she can make positive changes in their cognitions that will positively impact her behaviors. Patient was able to discuss and process cognitive distortions that stem from her fears. Patient actively and appropriately engaged in the group. Patient was able to provide support and validation to other group members. Patient practiced active listening when interacting with the facilitator and other group members.   Therapeutic Modalities Cognitive Behavioral Therapy Motivational Interviewing  Venice Marcucci  CUEBAS-COLON, LCSW 05/23/2018 11:18 AM

## 2018-05-23 NOTE — Plan of Care (Signed)
Data: Patient is appropriate and cooperative to assessment. Patient denies SI/HI/AVH. Patient has completed daily self inventory worksheet. Patient reports anxiety. Patient has a pain rating of 0/10. Patient reports good sleep quality, appetite has been fair for the last 24 hours. Patient rates depression "3/10" , feelings of hopelessness "3/10" and anxiety "3/10" Patients goal for today is continue with "existing plan."  Action:  Q x 15 minute observation checks were completed for safety. Patient was provided with education on medications. Patient was offered support and encouragement. Patient was given scheduled medications. Patient  was encourage to attend groups, participate in unit activities and continue with plan of care.     Response: Patient is medication compliant. Patient has no complaints at this time. Patient is receptive to treatment and safety maintained on unit.    Problem: Self-Concept: Goal: Ability to disclose and discuss suicidal ideas will improve Outcome: Progressing   Problem: Coping: Goal: Will verbalize feelings Outcome: Progressing   Problem: Safety: Goal: Ability to remain free from injury will improve Outcome: Progressing

## 2018-05-23 NOTE — Progress Notes (Signed)
Patient  Has been in the milieu, mainly in the dayroom with staff and peers. Alert and oriented. Calm and cooperative. Interacting with peers and staff appropriately. Has no concerns so far. Staff continue to provide support and encouragements.

## 2018-05-23 NOTE — Plan of Care (Signed)
Visible in the milieu, cooperative.

## 2018-05-23 NOTE — Progress Notes (Signed)
[facility] MD Progress Note  05/23/2018 4:09 PM CRISTELLA STIVER  MRN:  [id number]  Subjective:   Ms. Ende has a lifelong history of severe untreated anxiety. She came to the hospital after aborted suicide attempt by medication overdose. She had 200 pills she wanted to swallow but she flushed them down the toilet. She is still depressed and anxious.  Today patient endorses that the only thing bothering her is her upper extremity tremor.  Endorses that this has happened in the past, but has not been as severe as it is now.  Denies a history of alcohol use disorder or benzodiazepine use disorder.  Denies any suicidal thoughts, auditory visual hallucinations.  Principal Problem: Severe recurrent major depression without psychotic features (HCC) Diagnosis:   Patient Active Problem List   Diagnosis Date Noted  . Severe recurrent major depression without psychotic features (HCC) [F33.2] 05/20/2018  . Cannabis use disorder, moderate, dependence (HCC) [F12.20] 05/20/2018  . Suicidal ideation [R45.851] 05/20/2018  . UTI (urinary tract infection) [N39.0] 05/20/2018  . Tobacco use disorder [F17.200] 05/20/2018  . OCD (obsessive compulsive disorder) [F42.9] 05/20/2018  . PTSD (post-traumatic stress disorder) [F43.10] 05/20/2018   Total Time spent with patient: 20 minutes  Past Psychiatric History: anxiety, depression, mood instability  Past Medical History:  Past Medical History:  Diagnosis Date  . Anxiety   . Ovarian cyst   . Vertigo     Past Surgical History:  Procedure Laterality Date  . APPENDECTOMY    . MYOMECTOMY    . TUBAL LIGATION     Family History:  Family History  Problem Relation Age of Onset  . Cancer Other   . Heart failure Other   . Diabetes Other    Family Psychiatric  History: anxiety, OCD Social History:  Social History   Substance and Sexual Activity  Alcohol Use Not Currently     Social History   Substance and Sexual Activity  Drug Use Yes  . Types:  Marijuana    Social History   Socioeconomic History  . Marital status: Single    Spouse name: Not on file  . Number of children: Not on file  . Years of education: Not on file  . Highest education level: Not on file  Occupational History  . Not on file  Social Needs  . Financial resource strain: Not on file  . Food insecurity:    Worry: Not on file    Inability: Not on file  . Transportation needs:    Medical: Not on file    Non-medical: Not on file  Tobacco Use  . Smoking status: Current Some Day Smoker    Packs/day: 0.25    Types: Cigarettes  . Smokeless tobacco: Never Used  Substance and Sexual Activity  . Alcohol use: Not Currently  . Drug use: Yes    Types: Marijuana  . Sexual activity: Yes    Birth control/protection: Surgical  Lifestyle  . Physical activity:    Days per week: Not on file    Minutes per session: Not on file  . Stress: Not on file  Relationships  . Social connections:    Talks on phone: Not on file    Gets together: Not on file    Attends religious service: Not on file    Active member of club or organization: Not on file    Attends meetings of clubs or organizations: Not on file    Relationship status: Not on file  Other Topics Concern  . Not  on file  Social History Narrative  . Not on file   Additional Social History:                         Sleep: Fair  Appetite:  Fair  Current Medications: Current Facility-Administered Medications  Medication Dose Route Frequency Provider Last Rate Last Dose  . acetaminophen (TYLENOL) tablet 650 mg  650 mg Oral Q6H PRN Clapacs, Jackquline Denmark, MD   650 mg at 05/22/18 2148  . alum & mag hydroxide-simeth (MAALOX/MYLANTA) 200-200-20 MG/5ML suspension 30 mL  30 mL Oral Q4H PRN Clapacs, John T, MD      . fluvoxaMINE (LUVOX) tablet 100 mg  100 mg Oral QHS Pucilowska, Jolanta B, MD   100 mg at 05/22/18 2148  . hydrOXYzine (ATARAX/VISTARIL) tablet 50 mg  50 mg Oral TID PRN Clapacs, Jackquline Denmark, MD   50 mg  at 05/21/18 0848  . lamoTRIgine (LAMICTAL) tablet 25 mg  25 mg Oral QHS Pucilowska, Jolanta B, MD   25 mg at 05/22/18 2148  . magnesium hydroxide (MILK OF MAGNESIA) suspension 30 mL  30 mL Oral Daily PRN Clapacs, John T, MD      . nicotine (NICODERM CQ - dosed in mg/24 hours) patch 21 mg  21 mg Transdermal Daily Pucilowska, Jolanta B, MD   21 mg at 05/23/18 0911  . prazosin (MINIPRESS) capsule 2 mg  2 mg Oral BID Pucilowska, Jolanta B, MD   2 mg at 05/23/18 0911  . zolpidem (AMBIEN) tablet 5 mg  5 mg Oral QHS PRN Pucilowska, Jolanta B, MD   5 mg at 05/22/18 2148    Lab Results: No results found for this or any previous visit (from the past 48 hour(s)).  Blood Alcohol level:  Lab Results  Component Value Date   ETH <10 05/19/2018    Metabolic Disorder Labs: No results found for: HGBA1C, MPG No results found for: PROLACTIN No results found for: CHOL, TRIG, HDL, CHOLHDL, VLDL, LDLCALC  Physical Findings: AIMS: Facial and Oral Movements Muscles of Facial Expression: None, normal Lips and Perioral Area: None, normal Jaw: None, normal Tongue: None, normal,Extremity Movements Upper (arms, wrists, hands, fingers): None, normal Lower (legs, knees, ankles, toes): None, normal, Trunk Movements Neck, shoulders, hips: None, normal, Overall Severity Severity of abnormal movements (highest score from questions above): None, normal Incapacitation due to abnormal movements: None, normal Patient's awareness of abnormal movements (rate only patient's report): No Awareness, Dental Status Current problems with teeth and/or dentures?: No Does patient usually wear dentures?: No  CIWA:  CIWA-Ar Total: 2 COWS:  COWS Total Score: 2  Musculoskeletal: Strength & Muscle Tone: within normal limits Gait & Station: normal Patient leans: N/A  Psychiatric Specialty Exam: Physical Exam  Nursing note and vitals reviewed. Psychiatric: Her speech is normal. Thought content normal. Her mood appears anxious.  Cognition and memory are normal.    Review of Systems  Neurological: Negative.   Psychiatric/Behavioral: The patient is nervous/anxious.   All other systems reviewed and are negative.   Blood pressure 110/81, pulse 74, temperature 98.7 F (37.1 C), temperature source Oral, resp. rate 18, height 5\' 2"  (1.575 m), weight 52.2 kg, SpO2 100 %.Body mass index is 21.03 kg/m.  General Appearance: Casual  Eye Contact:  Good  Speech:  Clear and Coherent  Volume:  Decreased  Mood:  Anxious and Depressed  Affect:  Tearful  Thought Process:  Goal Directed and Descriptions of Associations: Intact  Orientation:  Full (  Time, Place, and Person)  Thought Content:  WDL  Suicidal Thoughts:  No  Homicidal Thoughts:  No  Memory:  Immediate;   Fair Recent;   Fair Remote;   Fair  Judgement:  Impaired  Insight:  Present  Psychomotor Activity:  Psychomotor Retardation  Concentration:  Concentration: Fair and Attention Span: Fair  Recall:  [employer]Fair  Fund of Knowledge:  Fair  Language:  Fair  Akathisia:  No  Handed:  Right  AIMS (if indicated):     Assets:  Communication Skills Desire for Improvement Financial Resources/Insurance Housing Physical Health Resilience Social Support  ADL's:  Intact  Cognition:  WNL  Sleep:  Number of Hours: 6.45     Treatment Plan Summary: Daily contact with patient to assess and evaluate symptoms and progress in treatment and Medication management   Ms. Earlene PlaterWallace is a 32 year old female with a history of depression and severe anxiety admitted for aborted suicide attempt by overdose. She tolerates medications well.  #Suicidal ideation, resolved -patient able to contract for safety in the hospital  #Mood, still depressed -increase Luvox to 100 mg nightly for depression, anxiety -continue Lamictal 25 mg for mood stabilization  #Anxiety, no major panic attacks -continue Minipress 2 mg BID for PTSD - BP stable  #Insomnia, improved with treatment -change  Ambien 5 mg nightly to PRN  #UTI -received a single dose of Manurol  #Cannabis use disorder -minimizes problems and declines treatment  #Smoking cessation -Nicotine patch is available  #Labs -pregnancy test is negative  #Disposition -discharge with family [facility]  Aundria RudGopalkumar Chiann Goffredo, MD 05/23/2018, 4:09 PM

## 2018-05-23 NOTE — [facility] Group Notes (Signed)
[facility] Group Notes:  (Nursing/MHT/Case Management/Adjunct)  Date:  05/23/2018  Time:  1:34 AM  Type of Therapy:  Group Therapy  Participation Level:  Active  Participation Quality:  Appropriate  Affect:  Appropriate  Cognitive:  Appropriate  Insight:  Appropriate  Engagement in Group:  Engaged  Modes of Intervention:  Discussion  Summary of Progress/Problems: Alicia Brown reported her goal was to develop an exit plan Alicia Brown stated she wanted to be able to implement the plan while incorporating her children in her life Alicia Brown stated her goal was to have services set up for her at her discharge Alicia Brown completed her self inventory sheet Alicia Brown indicated she was sleeping well Reports low depression and anxiety Denies harmful ideation to self and others Reports back and lower abdomen pain Reports ready to develop discharge plan MHT reviewed the rules and expectations of the unit. 1. Visitation hours and number of visitors 2. Phone hours 3. No food in rooms (explained why food was not permitted in rooms) 4. No sharing clothes or borrowing clothes 5. Explained how snack would be provided 6. No sharing personal information or last names 7. No touching, hugging, doing hair, or going into other patients rooms MHT processed with patients about the importance of attending groups MHT explained groups were designed to help patients learn new coping skills and gain insight into why they were admitted MHT explained learning new coping skills and new things about self would help prepare them for discharge MHT processed with group about after care and the importance of taking medications MHT encouraged patients to talk with providers about their care. MHT addressed concerns and answered questions related to care MHT informed patients to be active in creating their person-centered plan MHT informed group of services available upon discharge, even those who do not have insurance  Jinger NeighborsKeith D  Voula Waln 05/23/2018, 1:34 AM

## 2018-05-24 LAB — LIPID PANEL
CHOL/HDL RATIO: 5.2 ratio
CHOLESTEROL: 207 mg/dL — AB (ref 0–200)
HDL: 40 mg/dL — ABNORMAL LOW (ref >40)
LDL Cholesterol: 155 mg/dL — ABNORMAL HIGH (ref 0–99)
TRIGLYCERIDES: 60 mg/dL (ref <150)
VLDL: 12 mg/dL (ref 0–40)

## 2018-05-24 LAB — TSH: TSH: 1.378 u[IU]/mL (ref 0.350–4.500)

## 2018-05-24 MED ORDER — LAMOTRIGINE 25 MG PO TABS: 25.0000 mg | ORAL_TABLET | Freq: Every day | ORAL | 1 refills | Status: AC

## 2018-05-24 MED ORDER — FLUVOXAMINE MALEATE 100 MG PO TABS: 100.0000 mg | ORAL_TABLET | Freq: Every day | ORAL | 1 refills | Status: AC

## 2018-05-24 MED ORDER — HYDROXYZINE HCL 50 MG PO TABS: 50.0000 mg | ORAL_TABLET | Freq: Three times a day (TID) | ORAL | 1 refills | Status: AC | PRN

## 2018-05-24 MED ORDER — TRAZODONE HCL 50 MG PO TABS: 50.0000 mg | ORAL_TABLET | Freq: Every day | ORAL | Status: DC

## 2018-05-24 MED ORDER — TRAZODONE HCL 100 MG PO TABS: 100.0000 mg | ORAL_TABLET | Freq: Every day | ORAL | 1 refills | Status: AC

## 2018-05-24 MED ORDER — TRAZODONE HCL 100 MG PO TABS
100.0000 mg | ORAL_TABLET | Freq: Every day | ORAL | Status: DC
  Administered 2018-05-24: 100 mg via ORAL
  Filled 2018-05-24: qty 1

## 2018-05-24 MED ORDER — PRAZOSIN HCL 2 MG PO CAPS: 2.0000 mg | ORAL_CAPSULE | Freq: Two times a day (BID) | ORAL | 1 refills | Status: AC

## 2018-05-24 NOTE — Plan of Care (Signed)
  Problem: Self-Concept: Goal: Ability to disclose and discuss suicidal ideas will improve Outcome: Progressing   Problem: Coping: Goal: Will verbalize feelings Outcome: Progressing   Problem: Safety: Goal: Ability to disclose and discuss suicidal ideas will improve Outcome: Progressing   Problem: Coping: Goal: Coping ability will improve Outcome: Not Progressing

## 2018-05-24 NOTE — Progress Notes (Signed)
Patient pleasant and cooperative. Ready to discharge home and see son. Reports has a bday on Sunday and she would like to go home to be with him. Reports feeling better this evening. Patient visited with family. Notified talking and walking with peers on unit. In no distress. Medication compliant. Took trazadone for sleep.  Encouragement and support offered, safety checks maintained. Pt receptive and remains safe on unit with q 15 min checks.

## 2018-05-24 NOTE — Progress Notes (Signed)
Patient pleasant and cooperative. Medication compliant. Appropriate with staff and peers. Denies SI, HI, AVH. Endorses depression at 6. Reports it has increased over the last day. Pt reports not sleeping well last pm. Did not request medication. Patient reports took [place] prior night and it made her to loopy on the next day. Md notified, patient switched to trazadone. Encouragement and support offered. Safety checks maintained. Medications given as prescribed. Pt receptive and remains safe on unit with q15 min checks.

## 2018-05-24 NOTE — Progress Notes (Signed)
[facility] MD Progress Note  05/24/2018 2:25 PM Alicia Brown  MRN:  [id number]  Subjective:   Alicia Brown has a lifelong history of severe untreated anxiety. She came to the hospital after aborted suicide attempt by medication overdose. She had 200 pills she wanted to swallow but she flushed them down the toilet. She is still depressed and anxious.  Patient endorses that she was unable to sleep yesterday.  Would like Ambien taken out and given trazodone.  Denies any suicidal thoughts currently.  Is able to contract for safety.  Principal Problem: Severe recurrent major depression without psychotic features (HCC) Diagnosis:   Patient Active Problem List   Diagnosis Date Noted  . Severe recurrent major depression without psychotic features (HCC) [F33.2] 05/20/2018  . Cannabis use disorder, moderate, dependence (HCC) [F12.20] 05/20/2018  . Suicidal ideation [R45.851] 05/20/2018  . UTI (urinary tract infection) [N39.0] 05/20/2018  . Tobacco use disorder [F17.200] 05/20/2018  . OCD (obsessive compulsive disorder) [F42.9] 05/20/2018  . PTSD (post-traumatic stress disorder) [F43.10] 05/20/2018   Total Time spent with patient: 20 minutes  Past Psychiatric History: anxiety, depression, mood instability  Past Medical History:  Past Medical History:  Diagnosis Date  . Anxiety   . Ovarian cyst   . Vertigo     Past Surgical History:  Procedure Laterality Date  . APPENDECTOMY    . MYOMECTOMY    . TUBAL LIGATION     Family History:  Family History  Problem Relation Age of Onset  . Cancer Other   . Heart failure Other   . Diabetes Other    Family Psychiatric  History: anxiety, OCD Social History:  Social History   Substance and Sexual Activity  Alcohol Use Not Currently     Social History   Substance and Sexual Activity  Drug Use Yes  . Types: Marijuana    Social History   Socioeconomic History  . Marital status: Single    Spouse name: Not on file  . Number of  children: Not on file  . Years of education: Not on file  . Highest education level: Not on file  Occupational History  . Not on file  Social Needs  . Financial resource strain: Not on file  . Food insecurity:    Worry: Not on file    Inability: Not on file  . Transportation needs:    Medical: Not on file    Non-medical: Not on file  Tobacco Use  . Smoking status: Current Some Day Smoker    Packs/day: 0.25    Types: Cigarettes  . Smokeless tobacco: Never Used  Substance and Sexual Activity  . Alcohol use: Not Currently  . Drug use: Yes    Types: Marijuana  . Sexual activity: Yes    Birth control/protection: Surgical  Lifestyle  . Physical activity:    Days per week: Not on file    Minutes per session: Not on file  . Stress: Not on file  Relationships  . Social connections:    Talks on phone: Not on file    Gets together: Not on file    Attends religious service: Not on file    Active member of club or organization: Not on file    Attends meetings of clubs or organizations: Not on file    Relationship status: Not on file  Other Topics Concern  . Not on file  Social History Narrative  . Not on file   Additional Social History:  Sleep: Fair  Appetite:  Fair  Current Medications: Current Facility-Administered Medications  Medication Dose Route Frequency Provider Last Rate Last Dose  . acetaminophen (TYLENOL) tablet 650 mg  650 mg Oral Q6H PRN Clapacs, Jackquline Denmark, MD   650 mg at 05/22/18 2148  . alum & mag hydroxide-simeth (MAALOX/MYLANTA) 200-200-20 MG/5ML suspension 30 mL  30 mL Oral Q4H PRN Clapacs, John T, MD   30 mL at 05/24/18 1418  . fluvoxaMINE (LUVOX) tablet 100 mg  100 mg Oral QHS Pucilowska, Jolanta B, MD   100 mg at 05/23/18 2150  . hydrOXYzine (ATARAX/VISTARIL) tablet 50 mg  50 mg Oral TID PRN Clapacs, Jackquline Denmark, MD   50 mg at 05/21/18 0848  . lamoTRIgine (LAMICTAL) tablet 25 mg  25 mg Oral QHS Pucilowska, Jolanta B, MD   25 mg  at 05/23/18 2150  . magnesium hydroxide (MILK OF MAGNESIA) suspension 30 mL  30 mL Oral Daily PRN Clapacs, John T, MD      . nicotine (NICODERM CQ - dosed in mg/24 hours) patch 21 mg  21 mg Transdermal Daily Pucilowska, Jolanta B, MD   21 mg at 05/24/18 0807  . prazosin (MINIPRESS) capsule 2 mg  2 mg Oral BID Pucilowska, Jolanta B, MD   2 mg at 05/24/18 0807  . traZODone (DESYREL) tablet 50 mg  50 mg Oral QHS Aundria Rud, MD        Lab Results:  Results for orders placed or performed during the hospital encounter of 05/20/18 (from the past 48 hour(s))  TSH     Status: None   Collection Time: 05/24/18  8:11 AM  Result Value Ref Range   TSH 1.378 0.350 - 4.500 uIU/mL    Comment: Performed by a 3rd Generation assay with a functional sensitivity of <=0.01 uIU/mL. Performed at [facility], [address] [place], [place] [address]   Lipid panel     Status: Abnormal   Collection Time: 05/24/18  8:11 AM  Result Value Ref Range   Cholesterol 207 (H) 0 - 200 mg/dL   Triglycerides 60 <403 mg/dL   HDL 40 (L) >47 mg/dL   Total CHOL/HDL Ratio 5.2 RATIO   VLDL 12 0 - 40 mg/dL   LDL Cholesterol 425 (H) 0 - 99 mg/dL    Comment:        Total Cholesterol/HDL:CHD Risk Coronary Heart Disease Risk Table                     Men   Women  1/2 Average Risk   3.4   3.3  Average Risk       5.0   4.4  2 X Average Risk   9.6   7.1  3 X Average Risk  23.4   11.0        Use the calculated Patient Ratio above and the CHD Risk Table to determine the patient's CHD Risk.        ATP III CLASSIFICATION (LDL):  <100     mg/dL   Optimal  956-387  mg/dL   Near or Above                    Optimal  130-159  mg/dL   Borderline  564-332  mg/dL   High  >951     mg/dL   Very High Performed at [facility], [address], [place], [place] [address]     Blood Alcohol level:  Lab Results  Component Value Date   ETH <10 05/19/2018    Metabolic Disorder Labs: No results found for:  HGBA1C, MPG No results found for: PROLACTIN Lab Results  Component Value Date   CHOL 207 (H) 05/24/2018   TRIG 60 05/24/2018   HDL 40 (L) 05/24/2018   CHOLHDL 5.2 05/24/2018   VLDL 12 05/24/2018   LDLCALC 155 (H) 05/24/2018    Physical Findings: AIMS: Facial and Oral Movements Muscles of Facial Expression: None, normal Lips and Perioral Area: None, normal Jaw: None, normal Tongue: None, normal,Extremity Movements Upper (arms, wrists, hands, fingers): None, normal Lower (legs, knees, ankles, toes): None, normal, Trunk Movements Neck, shoulders, hips: None, normal, Overall Severity Severity of abnormal movements (highest score from questions above): None, normal Incapacitation due to abnormal movements: None, normal Patient's awareness of abnormal movements (rate only patient's report): No Awareness, Dental Status Current problems with teeth and/or dentures?: No Does patient usually wear dentures?: No  CIWA:  CIWA-Ar Total: 2 COWS:  COWS Total Score: 2  Musculoskeletal: Strength & Muscle Tone: within normal limits Gait & Station: normal Patient leans: N/A  Psychiatric Specialty Exam: Physical Exam  Nursing note and vitals reviewed. Psychiatric: Her speech is normal. Thought content normal. Her mood appears anxious. Cognition and memory are normal.    Review of Systems  Neurological: Negative.   Psychiatric/Behavioral: The patient is nervous/anxious.   All other systems reviewed and are negative.   Blood pressure (!) 79/60, pulse 100, temperature 98.7 F (37.1 C), temperature source Oral, resp. rate 20, height 5\' 2"  (1.575 m), weight 52.2 kg, SpO2 100 %.Body mass index is 21.03 kg/m.  General Appearance: Casual  Eye Contact:  Good  Speech:  Clear and Coherent  Volume:  Decreased  Mood:  Anxious and Depressed  Affect:  Tearful  Thought Process:  Goal Directed and Descriptions of Associations: Intact  Orientation:  Full (Time, Place, and Person)  Thought Content:   WDL  Suicidal Thoughts:  No  Homicidal Thoughts:  No  Memory:  Immediate;   Fair Recent;   Fair Remote;   Fair  Judgement:  Impaired  Insight:  Present  Psychomotor Activity:  Psychomotor Retardation  Concentration:  Concentration: Fair and Attention Span: Fair  Recall:  [employer] of Knowledge:  Fair  Language:  Fair  Akathisia:  No  Handed:  Right  AIMS (if indicated):     Assets:  Communication Skills Desire for Improvement Financial Resources/Insurance Housing Physical Health Resilience Social Support  ADL's:  Intact  Cognition:  WNL  Sleep:  Number of Hours: 5.15     Treatment Plan Summary: Daily contact with patient to assess and evaluate symptoms and progress in treatment and Medication management   Alicia Brown is a 32 year old female with a history of depression and severe anxiety admitted for aborted suicide attempt by overdose. She tolerates medications well.  #Suicidal ideation, resolved -patient able to contract for safety in the hospital  #Mood, still depressed -Continue Luvox  at 100 mg nightly for depression, anxiety -continue Lamictal 25 mg for mood stabilization  #Anxiety, no major panic attacks -continue Minipress 2 mg BID for PTSD - BP stable  #Insomnia, improved with treatment -change Ambien 5 mg nightly to PRN  #UTI -received a single dose of Manurol  #Cannabis use disorder -minimizes problems and declines treatment  #Smoking cessation -Nicotine patch is available  #Labs -pregnancy test is negative  #Disposition -discharge with family -follow up with [facility]  Aundria Rud, MD 05/24/2018, 2:25 PM

## 2018-05-24 NOTE — Discharge Summary (Signed)
Physician Discharge Summary Note  Patient:  Alicia Brown is an 32 y.o., female MRN:  [id number] DOB:  21-Jul-1986 Patient phone:  [phone] (home)  Patient address:   [place] [address],  Total Time spent with patient: 20 minutes plus 15 min on care coordination and documantation.  Date of Admission:  05/20/2018 Date of Discharge: 05/24/2018  Reason for Admission:  Aborted suicide attempt.  History of Present Illness:   Alicia Brown is a 32 year old female with a history of depression and anxiety.  Chief complaint. "Life snowballing."  History of present illness. Information was obtained from the patient and the chart. The patient came to the ER complaining of strong urges of two day duration to overdose on her medications. She realized, that she had access to over 200 pils of different kind and flushed them down the toiled afraid that she could do something irresponsible. It was very difficult to come to the ER because od stigma, her family rejecting the need for medications and fear that authorities might be involved when children ar left behind. She is an excellent mother and the children are safe with the grandmother, aunt and her sister. The patient has suffered severe anxiety most of her life with frequent and incapacitating panic attacks, severe social anxiety and agoraphobia, nightmares and flashbacks of PTSD and severe OCD symptoms. She denies psychotic symptoms except excessive worries about her children that borders on paranoia. The patient has had numerous severe stressors including major losses, unstable relationship with her finace who now fathers a baby wit another woman, challenges of raising special needs son with Asperger's. In fact, the last drop was her son's suspension from school after he stabbed his bully with a pencil. She is a devoted mother. The kids in return, take care of her in case of severe panic attacks, especially in public.  In fact, she very rarely ventures out of the house, almost never during the day. She has almost daily panic attacks that sometimes lst hours in spite of her coping skills. Recently, she lost consciousness while hyperventilating during panic attack, hit her head on the counter and broke two teeth.  Past psychiatric history. Never hospitalized, no suicide attempts. She was prescribed a sleeping pill and another for depression but did not take them as her family was against pharmacotherapy for religious reasons.   Family psychiatric history. Father with severe OCD. Two of his sisters with mental illness. One paranoid another died in car accident after "family intervention".  Social history. She graduated from high school and went to college some for early childhood education. She is a single mother of two children ages 33 and 80, the younger with Asperger's. No relationship with the father of her children. Her fiance is a Administrator, supportive but rarely at home. Has supportive family with mom, aunt and sister around. She has difficulties with employment due to severe anxiety and agoraphobia.   Principal Problem: Severe recurrent major depression without psychotic features [facility]) Discharge Diagnoses: Patient Active Problem List   Diagnosis Date Noted  . Severe recurrent major depression without psychotic features ([place]) [F33.2] 05/20/2018    Priority: High  . Cannabis use disorder, moderate, dependence ([place]) [F12.20] 05/20/2018  . Suicidal ideation [R45.851] 05/20/2018  . UTI (urinary tract infection) [N39.0] 05/20/2018  . Tobacco use disorder [F17.200] 05/20/2018  . OCD (obsessive compulsive disorder) [F42.9] 05/20/2018  . PTSD (post-traumatic stress disorder) [F43.10] 05/20/2018    Past Medical History:  Past Medical  History:  Diagnosis Date  . Anxiety   . Ovarian cyst   . Vertigo     Past Surgical History:  Procedure Laterality Date  . APPENDECTOMY    . MYOMECTOMY    . TUBAL  LIGATION     Family History:  Family History  Problem Relation Age of Onset  . Cancer Other   . Heart failure Other   . Diabetes Other    Social History:  Social History   Substance and Sexual Activity  Alcohol Use Not Currently     Social History   Substance and Sexual Activity  Drug Use Yes  . Types: Marijuana    Social History   Socioeconomic History  . Marital status: Single    Spouse name: Not on file  . Number of children: Not on file  . Years of education: Not on file  . Highest education level: Not on file  Occupational History  . Not on file  Social Needs  . Financial resource strain: Not on file  . Food insecurity:    Worry: Not on file    Inability: Not on file  . Transportation needs:    Medical: Not on file    Non-medical: Not on file  Tobacco Use  . Smoking status: Current Some Day Smoker    Packs/day: 0.25    Types: Cigarettes  . Smokeless tobacco: Never Used  Substance and Sexual Activity  . Alcohol use: Not Currently  . Drug use: Yes    Types: Marijuana  . Sexual activity: Yes    Birth control/protection: Surgical  Lifestyle  . Physical activity:    Days per week: Not on file    Minutes per session: Not on file  . Stress: Not on file  Relationships  . Social connections:    Talks on phone: Not on file    Gets together: Not on file    Attends religious service: Not on file    Active member of club or organization: Not on file    Attends meetings of clubs or organizations: Not on file    Relationship status: Not on file  Other Topics Concern  . Not on file  Social History Narrative  . Not on file    Hospital Course:    Alicia Brown is a 32 year old female with a history of depression and severe anxiety admitted for aborted suicide attempt by overdose. She accepted medications and tolerated them well. At the time of discharge, the patient is no longer suicial, homicidal or excessively anxious. She is able to contract for safety. She  is forward thinking and optimistic about the future.   #Mood, improved  -continueLuvox 149m nightly for depression, anxiety -continueLamictal titration starting with 25 mg nightly for mood stabilization  #Anxiety, improved -continue Minipress 2 mg BIDfor PTSD  #Insomnia, improved with treatment -continue Trazodone 100 mg nightly   #UTI -received a single dose of Manurol  #Cannabis use disorder -minimizes problems and declines treatment  #Smoking cessation -Nicotine patch ws available  #Labs -pregnancy test is negative  #Disposition -discharge with family -[facility] Physical Findings: AIMS: Facial and Oral Movements Muscles of Facial Expression: None, normal Lips and Perioral Area: None, normal Jaw: None, normal Tongue: None, normal,Extremity Movements Upper (arms, wrists, hands, fingers): None, normal Lower (legs, knees, ankles, toes): None, normal, Trunk Movements Neck, shoulders, hips: None, normal, Overall Severity Severity of abnormal movements (highest score from questions above): None, normal Incapacitation due to abnormal movements:  None, normal Patient's awareness of abnormal movements (rate only patient's report): No Awareness, Dental Status Current problems with teeth and/or dentures?: No Does patient usually wear dentures?: No  CIWA:  CIWA-Ar Total: 2 COWS:  COWS Total Score: 2  Musculoskeletal: Strength & Muscle Tone: within normal limits Gait & Station: normal Patient leans: N/A  Psychiatric Specialty Exam: Physical Exam  Nursing note and vitals reviewed. Psychiatric: She has a normal mood and affect. Her speech is normal and behavior is normal. Thought content normal. Cognition and memory are normal. She expresses impulsivity.    Review of Systems  Neurological: Negative.   Psychiatric/Behavioral: Negative.   All other systems reviewed and are negative.   Blood pressure (!) 128/104, pulse (!) 109,  temperature 98.6 F (37 C), temperature source Oral, resp. rate 20, height 5' 2"  (1.575 m), weight 52.2 kg, SpO2 100 %.Body mass index is 21.03 kg/m.  General Appearance: Casual  Eye Contact:  Good  Speech:  Clear and Coherent  Volume:  Normal  Mood:  Euthymic  Affect:  Appropriate  Thought Process:  Goal Directed and Descriptions of Associations: Intact  Orientation:  Full (Time, Place, and Person)  Thought Content:  WDL  Suicidal Thoughts:  No  Homicidal Thoughts:  No  Memory:  Immediate;   Fair Recent;   Fair Remote;   Fair  Judgement:  Fair  Insight:  Present  Psychomotor Activity:  Normal  Concentration:  Concentration: Fair and Attention Span: Fair  Recall:  [employer] of Knowledge:  Fair  Language:  Fair  Akathisia:  No  Handed:  Right  AIMS (if indicated):     Assets:  Communication Skills Desire for Improvement Financial Resources/Insurance [place] Talents/Skills  ADL's:  Intact  Cognition:  WNL  Sleep:  Number of Hours: 6.45     Have you used any form of tobacco in the last 30 days? (Cigarettes, Smokeless Tobacco, Cigars, and/or Pipes): Yes  Has this patient used any form of tobacco in the last 30 days? (Cigarettes, Smokeless Tobacco, Cigars, and/or Pipes) Yes, Yes, A prescription for an FDA-approved tobacco cessation medication was offered at discharge and the patient refused  Blood Alcohol level:  Lab Results  Component Value Date   ETH <10 35/57/3220    Metabolic Disorder Labs:  No results found for: HGBA1C, MPG No results found for: PROLACTIN Lab Results  Component Value Date   CHOL 207 (H) 05/24/2018   TRIG 60 05/24/2018   HDL 40 (L) 05/24/2018   CHOLHDL 5.2 05/24/2018   VLDL 12 05/24/2018   LDLCALC 155 (H) 05/24/2018    See Psychiatric Specialty Exam and Suicide Risk Assessment completed by Attending Physician prior to discharge.  Discharge destination:  Home  Is patient on multiple  antipsychotic therapies at discharge:  No   Has Patient had three or more failed trials of antipsychotic monotherapy by history:  No  Recommended Plan for Multiple Antipsychotic Therapies: NA  Discharge Instructions    Diet - low sodium heart healthy   Complete by:  As directed    Diet - low sodium heart healthy   Complete by:  As directed    Increase activity slowly   Complete by:  As directed    Increase activity slowly   Complete by:  As directed      Allergies as of 05/25/2018      Reactions   Other    "any type of lubrication"      Medication List  STOP taking these medications   ibuprofen 800 MG tablet Commonly known as:  ADVIL,MOTRIN   LORazepam 1 MG tablet Commonly known as:  ATIVAN   meclizine 25 MG tablet Commonly known as:  ANTIVERT   ondansetron 4 MG disintegrating tablet Commonly known as:  ZOFRAN-ODT     TAKE these medications     Indication  fluvoxaMINE 100 MG tablet Commonly known as:  LUVOX Take 1 tablet (100 mg total) by mouth at bedtime.  Indication:  Major Depressive Disorder, Posttraumatic Stress Disorder   hydrOXYzine 50 MG tablet Commonly known as:  ATARAX/VISTARIL Take 1 tablet (50 mg total) by mouth 3 (three) times daily as needed for anxiety.  Indication:  Feeling Anxious   lamoTRIgine 25 MG tablet Commonly known as:  LAMICTAL Take 1 tablet (25 mg total) by mouth at bedtime. Take 1 tab for 10days, 2 tabs for 2 weeks, 4 tabs for 2 weeks, 8 tabs or 200 mg nightly after  Indication:  Manic-Depression   prazosin 2 MG capsule Commonly known as:  MINIPRESS Take 1 capsule (2 mg total) by mouth 2 (two) times daily.  Indication:  PTSD   traZODone 100 MG tablet Commonly known as:  DESYREL Take 1 tablet (100 mg total) by mouth at bedtime.  Indication:  Trouble Sleeping      Follow-up [place] on 05/28/2018.   Why:  Please follow up with [facility] via their walk-in  clinic. They are open from 8am-5pm on Mondays-Fridays. Please bring your discharge paperwork and your medications that you are taking. Thank you. Contact information: [place] [address] [phone]           Follow-up recommendations:  Activity:  as tolerated Diet:  regular Other:  keep follow up appointment  Comments:    Signed: Orson Slick, MD 05/25/2018, 9:56 AM

## 2018-05-24 NOTE — [facility] Group Notes (Signed)
[facility] Group Notes:  (Nursing/MHT/Case Management/Adjunct)  Date:  05/24/2018  Time:  1:08 AM  Type of Therapy:  Group Therapy  Participation Level:  Active  Participation Quality:  Appropriate and She said if she could stop shaking, her knee was trembling.  Affect:  Appropriate  Cognitive:  Alert  Insight:  Good  Engagement in Group:  Engaged  Modes of Intervention:  Support  Summary of Progress/Problems:  Alicia NeerJackie Brown Alicia Brown 05/24/2018, 1:08 AM

## 2018-05-24 NOTE — [facility] Group Notes (Signed)
LCSW Group Therapy Note 05/24/2018 1:15pm  Type of Therapy and Topic: Group Therapy: Feelings Around Returning Home & Establishing a Supportive Framework and Supporting Oneself When Supports Not Available  Participation Level: Active  Description of Group:  Patients first processed thoughts and feelings about upcoming discharge. These included fears of upcoming changes, lack of change, new living environments, judgements and expectations from others and overall stigma of mental health issues. The group then discussed the definition of a supportive framework, what that looks and feels like, and how do to discern it from an unhealthy non-supportive network. The group identified different types of supports as well as what to do when your family/friends are less than helpful or unavailable  Therapeutic Goals  1. Patient will identify one healthy supportive network that they can use at discharge. 2. Patient will identify one factor of a supportive framework and how to tell it from an unhealthy network. 3. Patient able to identify one coping skill to use when they do not have positive supports from others. 4. Patient will demonstrate ability to communicate their needs through discussion and/or role plays.  Summary of Patient Progress:  Patient states "I am just waiting for the day to be over with." Pt engaged during group session. As patients processed their anxiety about discharge and described healthy supports patient shared she is ready to be discharge. She reports "I did not now what to call what I was going through . I have a better understanding on my diagnosis." Pt listed her best friend as her main support.  Patients identified at least one self-care tool they were willing to use after discharge; not holding things in, exercise, and dance.   Therapeutic Modalities Cognitive Behavioral Therapy Motivational Interviewing   Alicia Brown  CUEBAS-COLON, LCSW 05/24/2018 10:10 AM

## 2018-05-24 NOTE — [facility] Suicide Risk Assessment (Signed)
[facility]   Principal Problem: Severe recurrent major depression without psychotic features [facility](HCC) Discharge Diagnoses:  Patient Active Problem List   Diagnosis Date Noted  . Severe recurrent major depression without psychotic features (HCC) [F33.2] 05/20/2018    Priority: High  . Cannabis use disorder, moderate, dependence (HCC) [F12.20] 05/20/2018  . Suicidal ideation [R45.851] 05/20/2018  . UTI (urinary tract infection) [N39.0] 05/20/2018  . Tobacco use disorder [F17.200] 05/20/2018  . OCD (obsessive compulsive disorder) [F42.9] 05/20/2018  . PTSD (post-traumatic stress disorder) [F43.10] 05/20/2018    Total Time spent with patient: 20 minutes  Musculoskeletal: Strength & Muscle Tone: within normal limits Gait & Station: normal Patient leans: N/A  Psychiatric Specialty Exam: Review of Systems  Neurological: Negative.   Psychiatric/Behavioral: Negative.   All other systems reviewed and are negative.   Blood pressure (!) 128/104, pulse (!) 109, temperature 98.6 F (37 C), temperature source Oral, resp. rate 20, height 5\' 2"  (1.575 m), weight 52.2 kg, SpO2 100 %.Body mass index is 21.03 kg/m.  General Appearance: Casual  Eye Contact::  Good  Speech:  Clear and Coherent409  Volume:  Normal  Mood:  Euthymic  Affect:  Appropriate  Thought Process:  Goal Directed and Descriptions of Associations: Intact  Orientation:  Full (Time, Place, and Person)  Thought Content:  WDL  Suicidal Thoughts:  No  Homicidal Thoughts:  No  Memory:  Immediate;   Fair Recent;   Fair Remote;   Fair  Judgement:  Fair  Insight:  Present  Psychomotor Activity:  Normal  Concentration:  Fair  Recall:  FiservFair  Fund of Knowledge:Fair  Language: Fair  Akathisia:  No  Handed:  Right  AIMS (if indicated):     Assets:  Communication Skills Desire for Improvement Financial Resources/Insurance Housing Physical Health Resilience Social Support  Sleep:  Number of Hours:  6.45  Cognition: WNL  ADL's:  Intact   Mental Status Per Nursing Assessment::   On Admission:  Self-harm thoughts  Demographic Factors:  Unemployed  Loss Factors: Decrease in vocational status  Historical Factors: Impulsivity  Risk Reduction Factors:   Responsible for children under 918 years of age, Sense of responsibility to family, Living with another person, especially a relative, Positive social support and Positive coping skills or problem solving skills  Continued Clinical Symptoms:  Severe Anxiety and/or Agitation Bipolar Disorder:   Depressive phase  Cognitive Features That Contribute To Risk:  None    Suicide Risk:  Minimal: No identifiable suicidal ideation.  Patients presenting with no risk factors but with morbid ruminations; may be classified as minimal risk based on the severity of the depressive symptoms  Follow-up Information    [facility] Go on 05/28/2018.   Why:  Please follow up with [facility] via their walk-in clinic. They are open from 8am-5pm on Mondays-Fridays. Please bring your discharge paperwork and your medications that you are taking. Thank you. Contact information: [address] [place] [address] [phone]           Plan Of Care/Follow-up recommendations:  Activity:  as tolerated Diet:  regular Other:  keep follow up appoitments  Kristine LineaJolanta Pucilowska, MD 05/25/2018, 9:56 AM

## 2018-05-24 NOTE — Progress Notes (Signed)
Patient reports not feeling well. VS checked, bp low. Gatorade given. Left MD message on vm to return call to unit. Will continue to monitor and assess.

## 2018-05-25 NOTE — Progress Notes (Signed)
Recreation Therapy Notes   Date: 05/25/2018  Time: 9:30 am  Location: Craft Room  Behavioral response: Appropriate    Intervention Topic: Communication  Discussion/Intervention:  Group content today was focused on communication. The group defined communication and ways to communicate with others. Individuals stated reason why communication is important and some reasons to communicate with others. Patients expressed if they thought they were good at communicating with others and ways they could improve their communication skills. The group identified important parts of communication and some experiences they have had in the past with communication. The group participated in the intervention "What is that?", where they had a chance to test out their communication skills and identify ways to improve their communication techniques.  Clinical Observations/Feedback:  Patient came to group late due to unknown reasons. Individual was social with peers and staff while participating in the intervention.  Aracely Rickett LRT/CTRS          Artie Takayama 05/25/2018 3:22 PM

## 2018-05-25 NOTE — Plan of Care (Signed)
Cooperative and compliant with treatment 

## 2018-05-25 NOTE — Progress Notes (Signed)
  [facility] :  Will you be returning to the same living situation after discharge:  Yes,  Home At discharge, do you have transportation home?: Yes,  Taxi at discharge Do you have the ability to pay for your medications: Yes,  Medicaid  Release of information consent forms completed and in the chart;  Patient's signature needed at discharge.  Patient to Follow up at: Follow-up Information    [facility] Go on 05/28/2018.   Why:  Please follow up with [facility] via their walk-in clinic. They are open from 8am-5pm on Mondays-Fridays. Please bring your discharge paperwork and your medications that you are taking. Thank you. Contact information: [address] [place] [address] [phone]           Next level of care provider has [facility] Link:no  Safety Planning and Suicide Prevention discussed: Yes,  Completed with the patient  Have you used any form of tobacco in the last 30 days? (Cigarettes, Smokeless Tobacco, Cigars, and/or Pipes): Yes  Has patient been referred to the Quitline?: Patient refused referral  Patient has been referred for addiction treatment: Yes  Johny ShearsCassandra  Mkayla Steele, LCSW 05/25/2018, 9:39 AM

## 2018-05-25 NOTE — Progress Notes (Signed)
Recreation Therapy Notes  INPATIENT RECREATION TR PLAN  Patient Details Name: Alicia Brown MRN: [id number] DOB: 11-30-1985 Today's Date: 05/25/2018  Rec Therapy Plan Is patient appropriate for Therapeutic Recreation?: Yes Treatment times per week: at least 3 Estimated Length of Stay: 5-7 days TR Treatment/Interventions: Group participation (Comment)  Discharge Criteria Pt will be discharged from therapy if:: Discharged Treatment plan/goals/alternatives discussed and agreed upon by:: Patient/family  Discharge Summary Short term goals set: Patient will engage in groups without prompting or encouragement from LRT x3 group sessions within 5 recreation therapy group sessions Short term goals met: Adequate for discharge Progress toward goals comments: Groups attended Which groups?: Communication, Leisure education, Other (Comment)(Creative expressions) Reason goals not met: N/A Therapeutic equipment acquired: N/A Reason patient discharged from therapy: Discharge from hospital Pt/family agrees with progress & goals achieved: Yes Date patient discharged from therapy: 05/25/18   Lynnea Vandervoort 05/25/2018, 3:26 PM

## 2018-05-25 NOTE — Progress Notes (Signed)
Patient ID: Alicia Brown, female   DOB: [birth year], 32 y.o.   MRN: [id number]   Discharge Note:  Patient denies SI/HI/AVH at this time. Discharge instructions, AVS, prescriptions, and transition record gone over with patient. Patient agrees to comply with medication management, follow-up visit, and outpatient therapy. Patient belongings returned to patient. Patient questions and concerns addressed and answered. Patient ambulatory off unit. Patient discharged to home with Mother.

## 2018-05-26 LAB — HEMOGLOBIN A1C
Hgb A1c MFr Bld: 5 % (ref 4.8–5.6)
MEAN PLASMA GLUCOSE: 97 mg/dL

## 2018-07-20 ENCOUNTER — Other Ambulatory Visit: Payer: Self-pay

## 2018-07-20 ENCOUNTER — Encounter (HOSPITAL_BASED_OUTPATIENT_CLINIC_OR_DEPARTMENT_OTHER): Payer: Self-pay

## 2018-07-20 ENCOUNTER — Emergency Department (HOSPITAL_BASED_OUTPATIENT_CLINIC_OR_DEPARTMENT_OTHER)
Admission: EM | Admit: 2018-07-20 | Discharge: 2018-07-20 | Disposition: A | Discharge status: Home / Self Care | Payer: Medicaid Other | Attending: Emergency Medicine | Admitting: Emergency Medicine

## 2018-07-20 ENCOUNTER — Emergency Department (HOSPITAL_BASED_OUTPATIENT_CLINIC_OR_DEPARTMENT_OTHER): Payer: Medicaid Other

## 2018-07-20 DIAGNOSIS — F419 Anxiety disorder, unspecified: Secondary | ICD-10-CM | POA: Insufficient documentation

## 2018-07-20 DIAGNOSIS — Y998 Other external cause status: Secondary | ICD-10-CM | POA: Diagnosis not present

## 2018-07-20 DIAGNOSIS — Z79899 Other long term (current) drug therapy: Secondary | ICD-10-CM | POA: Insufficient documentation

## 2018-07-20 DIAGNOSIS — S6992XA Unspecified injury of left wrist, hand and finger(s), initial encounter: Secondary | ICD-10-CM | POA: Diagnosis present

## 2018-07-20 DIAGNOSIS — Y9289 Other specified places as the place of occurrence of the external cause: Secondary | ICD-10-CM | POA: Diagnosis not present

## 2018-07-20 DIAGNOSIS — Y9389 Activity, other specified: Secondary | ICD-10-CM | POA: Insufficient documentation

## 2018-07-20 DIAGNOSIS — F17210 Nicotine dependence, cigarettes, uncomplicated: Secondary | ICD-10-CM | POA: Insufficient documentation

## 2018-07-20 DIAGNOSIS — W2209XA Striking against other stationary object, initial encounter: Secondary | ICD-10-CM | POA: Insufficient documentation

## 2018-07-20 NOTE — Discharge Instructions (Signed)
You have been seen today for a wrist injury. There were no acute abnormalities on the x-rays, including no sign of fracture or dislocation, however, there could be injuries to the soft tissues, such as the ligaments or tendons that are not seen on xrays. There could also be what are called occult fractures that are small fractures not seen on xray. Antiinflammatory medications: Take 600 mg of ibuprofen every 6 hours or 440 mg (over the counter dose) to 500 mg (prescription dose) of naproxen every 12 hours for the next 3 days. After this time, these medications may be used as needed for pain. Take these medications with food to avoid upset stomach. Choose only one of these medications, do not take them together. Acetaminophen (generic for Tylenol): Should you continue to have additional pain while taking the ibuprofen or naproxen, you may add in acetaminophen as needed. Your daily total maximum amount of acetaminophen from all sources should be limited to 4000mg/day for persons without liver problems, or 2000mg/day for those with liver problems. Ice: May apply ice to the area over the next 24 hours for 15 minutes at a time to reduce swelling. Elevation: Keep the extremity elevated as often as possible to reduce pain and inflammation. Support: Wear the splint for support and comfort. Wear this until pain resolves.  Follow up: If symptoms are improving, you may follow up with your primary care provider for any continued management. If symptoms are not starting to improve within a week, you should follow up with the orthopedic specialist within two weeks. Return: Return to the ED for numbness, weakness, increasing pain, overall worsening symptoms, loss of function, or if symptoms are not improving, you have tried to follow up with the orthopedic specialist, and have been unable to do so.  For prescription assistance, may try using prescription discount sites or apps, such as goodrx.com 

## 2018-07-20 NOTE — ED Provider Notes (Signed)
[facility] EMERGENCY DEPARTMENT Provider Note   CSN: [id number] Arrival date & time: 07/20/18  1720     History   Chief Complaint Chief Complaint  Patient presents with  . Wrist Injury    HPI Alicia Brown is a 32 y.o. female.  HPI   Alicia Brown is a 32 y.o. female, with a history of anxiety, presenting to the ED with left wrist injury that occurred 2 days ago.  States she hit her wrist on the table.  Complains of throbbing, moderate pain to the ulnar side, nonradiating.  Accompanied by bruising. Denies weakness, numbness, hand pain, or any other complaints.   Past Medical History:  Diagnosis Date  . Anxiety   . Ovarian cyst   . Vertigo     Patient Active Problem List   Diagnosis Date Noted  . Severe recurrent major depression without psychotic features (HCC) 05/20/2018  . Cannabis use disorder, moderate, dependence (HCC) 05/20/2018  . Suicidal ideation 05/20/2018  . UTI (urinary tract infection) 05/20/2018  . Tobacco use disorder 05/20/2018  . OCD (obsessive compulsive disorder) 05/20/2018  . PTSD (post-traumatic stress disorder) 05/20/2018    Past Surgical History:  Procedure Laterality Date  . APPENDECTOMY    . MYOMECTOMY    . TUBAL LIGATION       OB History    Gravida  5   Para  4   Term  2   Preterm  2   AB  1   Living  2     SAB  1   TAB      Ectopic      Multiple      Live Births               Home Medications    Prior to Admission medications   Medication Sig Start Date End Date Taking? Authorizing Provider  fluvoxaMINE (LUVOX) 100 MG tablet Take 1 tablet (100 mg total) by mouth at bedtime. 05/24/18   Pucilowska, Braulio Conte B, MD  hydrOXYzine (ATARAX/VISTARIL) 50 MG tablet Take 1 tablet (50 mg total) by mouth 3 (three) times daily as needed for anxiety. 05/24/18   Pucilowska, Braulio Conte B, MD  lamoTRIgine (LAMICTAL) 25 MG tablet Take 1 tablet (25 mg total) by mouth at bedtime. Take 1 tab for 10days, 2  tabs for 2 weeks, 4 tabs for 2 weeks, 8 tabs or 200 mg nightly after 05/24/18   Pucilowska, Jolanta B, MD  prazosin (MINIPRESS) 2 MG capsule Take 1 capsule (2 mg total) by mouth 2 (two) times daily. 05/25/18   Pucilowska, Braulio Conte B, MD  traZODone (DESYREL) 100 MG tablet Take 1 tablet (100 mg total) by mouth at bedtime. 05/24/18   Shari Prows, MD    Family History Family History  Problem Relation Age of Onset  . Cancer Other   . Heart failure Other   . Diabetes Other     Social History Social History   Tobacco Use  . Smoking status: Current Some Day Smoker    Packs/day: 0.25    Types: Cigarettes  . Smokeless tobacco: Never Used  Substance Use Topics  . Alcohol use: Not Currently  . Drug use: Yes    Types: Marijuana     Allergies   Other   Review of Systems Review of Systems  Musculoskeletal: Positive for arthralgias.  Neurological: Negative for weakness and numbness.     Physical Exam Updated Vital Signs BP 121/66 (BP Location: Right Arm)   Pulse 92  Temp 98.9 F (37.2 C) (Oral)   Resp 18   Ht 5\' 2"  (1.575 m)   Wt 54.4 kg   LMP 07/08/2018   SpO2 100%   BMI 21.95 kg/m   Physical Exam  Constitutional: She appears well-developed and well-nourished. No distress.  HENT:  Head: Normocephalic and atraumatic.  Eyes: Conjunctivae are normal.  Neck: Neck supple.  Cardiovascular: Normal rate, regular rhythm and intact distal pulses.  Pulmonary/Chest: Effort normal.  Musculoskeletal: She exhibits tenderness.  Some tenderness to the ulnar wrist with associated bruising.  No deformity.  Full range of motion in the wrist without noted difficulty. No tenderness, swelling, bruising, deformity, or other abnormality in the patient's hand.  Neurological: She is alert.  Sensation grossly intact to light touch through each of the nerve distributions of the bilateral upper extremities. Abduction and adduction of the fingers intact against resistance. Grip strength  equal bilaterally. Supination and pronation intact against resistance. Strength 5/5 through the cardinal directions of the bilateral wrists. Patient can touch the thumb to each one of the fingertips without difficulty.   Skin: Skin is warm and dry. Capillary refill takes less than 2 seconds. She is not diaphoretic. No pallor.  Psychiatric: She has a normal mood and affect. Her behavior is normal.  Nursing note and vitals reviewed.    ED Treatments / Results  Labs (all labs ordered are listed, but only abnormal results are displayed) Labs Reviewed - No data to display  EKG None  Radiology Dg Wrist Complete Left  Result Date: 07/20/2018 CLINICAL DATA:  Pain after hitting wrist against table EXAM: LEFT WRIST - COMPLETE 3+ VIEW COMPARISON:  None. FINDINGS: Frontal, oblique, lateral, and ulnar deviation scaphoid images were obtained. No fracture or dislocation. Joint spaces appear normal. No erosive change. IMPRESSION: No fracture or dislocation.  No appreciable arthropathy. Electronically Signed   By: Bretta Bang III M.D.   On: 07/20/2018 18:07    Procedures Procedures (including critical care time)  Medications Ordered in ED Medications - No data to display   Initial Impression / Assessment and Plan / ED Course  I have reviewed the triage vital signs and the nursing notes.  Pertinent labs & imaging results that were available during my care of the patient were reviewed by me and considered in my medical decision making (see chart for details).     Patient presents with left wrist injury.  No acute abnormality on x-ray.  Neurovascularly intact.  Placed in a Velcro splint.  PCP versus orthopedic follow-up. The patient was given instructions for home care as well as return precautions. Patient voices understanding of these instructions, accepts the plan, and is comfortable with discharge.    Final Clinical Impressions(s) / ED Diagnoses   Final diagnoses:  Injury of left  wrist, initial encounter    ED Discharge Orders    None       Concepcion Living 07/20/18 1936    Azalia Bilis, MD 07/20/18 1940

## 2018-07-20 NOTE — ED Triage Notes (Signed)
Pt states she slammed both wrist down on table 2 days ago-pain, bruising to left wrist-NAD-steady gait

## 2020-04-08 ENCOUNTER — Encounter (HOSPITAL_BASED_OUTPATIENT_CLINIC_OR_DEPARTMENT_OTHER): Payer: Self-pay | Admitting: *Deleted

## 2020-04-08 ENCOUNTER — Emergency Department (HOSPITAL_BASED_OUTPATIENT_CLINIC_OR_DEPARTMENT_OTHER)
Admission: EM | Admit: 2020-04-08 | Discharge: 2020-04-08 | Disposition: A | Discharge status: Home / Self Care | Payer: Medicaid Other | Attending: Emergency Medicine | Admitting: Emergency Medicine

## 2020-04-08 ENCOUNTER — Other Ambulatory Visit: Payer: Self-pay

## 2020-04-08 DIAGNOSIS — F17210 Nicotine dependence, cigarettes, uncomplicated: Secondary | ICD-10-CM | POA: Insufficient documentation

## 2020-04-08 DIAGNOSIS — L662 Folliculitis decalvans: Secondary | ICD-10-CM | POA: Diagnosis not present

## 2020-04-08 DIAGNOSIS — F1590 Other stimulant use, unspecified, uncomplicated: Secondary | ICD-10-CM | POA: Diagnosis not present

## 2020-04-08 DIAGNOSIS — L739 Follicular disorder, unspecified: Secondary | ICD-10-CM

## 2020-04-08 DIAGNOSIS — L0231 Cutaneous abscess of buttock: Secondary | ICD-10-CM | POA: Diagnosis present

## 2020-04-08 MED ORDER — DOXYCYCLINE HYCLATE 100 MG PO CAPS: 100.0000 mg | ORAL_CAPSULE | Freq: Two times a day (BID) | ORAL | 0 refills | Status: AC

## 2020-04-08 MED ORDER — DOXYCYCLINE HYCLATE 100 MG PO TABS
100.0000 mg | ORAL_TABLET | Freq: Once | ORAL | Status: AC
  Administered 2020-04-08: 100 mg via ORAL
  Filled 2020-04-08: qty 1

## 2020-04-08 NOTE — ED Triage Notes (Signed)
C/o abscess to left buttocks x 2 days

## 2020-04-08 NOTE — ED Provider Notes (Signed)
[facility] EMERGENCY DEPARTMENT Provider Note   CSN: [id number] Arrival date & time: 04/08/20  0263     History Chief Complaint  Patient presents with  . Abscess    Alicia Brown is a 34 y.o. female.  Patient presents with concerns over abscess on the left buttock.  Patient reports that symptoms began 2 days ago.  She reports to tender swollen area areas that are not draining.  No fever.        Past Medical History:  Diagnosis Date  . Anxiety   . Ovarian cyst   . Vertigo     Patient Active Problem List   Diagnosis Date Noted  . Severe recurrent major depression without psychotic features (HCC) 05/20/2018  . Cannabis use disorder, moderate, dependence (HCC) 05/20/2018  . Suicidal ideation 05/20/2018  . UTI (urinary tract infection) 05/20/2018  . Tobacco use disorder 05/20/2018  . OCD (obsessive compulsive disorder) 05/20/2018  . PTSD (post-traumatic stress disorder) 05/20/2018    Past Surgical History:  Procedure Laterality Date  . APPENDECTOMY    . MYOMECTOMY    . TUBAL LIGATION       OB History    Gravida  5   Para  4   Term  2   Preterm  2   AB  1   Living  2     SAB  1   TAB      Ectopic      Multiple      Live Births              Family History  Problem Relation Age of Onset  . Cancer Other   . Heart failure Other   . Diabetes Other     Social History   Tobacco Use  . Smoking status: Current Some Day Smoker    Packs/day: 0.25    Types: Cigarettes  . Smokeless tobacco: Never Used  Vaping Use  . Vaping Use: Never used  Substance Use Topics  . Alcohol use: Not Currently  . Drug use: Yes    Types: Marijuana    Home Medications Prior to Admission medications   Medication Sig Start Date End Date Taking? Authorizing Provider  doxycycline (VIBRAMYCIN) 100 MG capsule Take 1 capsule (100 mg total) by mouth 2 (two) times daily. 04/08/20   Gilda Crease, MD  fluvoxaMINE (LUVOX) 100 MG tablet Take  1 tablet (100 mg total) by mouth at bedtime. 05/24/18   Pucilowska, Braulio Conte B, MD  hydrOXYzine (ATARAX/VISTARIL) 50 MG tablet Take 1 tablet (50 mg total) by mouth 3 (three) times daily as needed for anxiety. 05/24/18   Pucilowska, Braulio Conte B, MD  lamoTRIgine (LAMICTAL) 25 MG tablet Take 1 tablet (25 mg total) by mouth at bedtime. Take 1 tab for 10days, 2 tabs for 2 weeks, 4 tabs for 2 weeks, 8 tabs or 200 mg nightly after 05/24/18   Pucilowska, Jolanta B, MD  prazosin (MINIPRESS) 2 MG capsule Take 1 capsule (2 mg total) by mouth 2 (two) times daily. 05/25/18   Pucilowska, Braulio Conte B, MD  traZODone (DESYREL) 100 MG tablet Take 1 tablet (100 mg total) by mouth at bedtime. 05/24/18   Pucilowska, Ellin Goodie, MD    Allergies    Other  Review of Systems   Review of Systems  Constitutional: Negative for fever.  Skin: Positive for wound.    Physical Exam Updated Vital Signs BP 120/78   Pulse 74   Temp 98.3 F (36.8 C)  Resp 18   Ht 5\' 7"  (1.702 m)   Wt 56.2 kg   LMP 03/24/2020   SpO2 100%   BMI 19.42 kg/m   Physical Exam Vitals and nursing note reviewed. Exam conducted with a chaperone present.  Constitutional:      Appearance: Normal appearance.  Genitourinary:   Skin:    Comments: No vesicles   Neurological:     Mental Status: She is alert and oriented to person, place, and time.     Sensory: Sensation is intact.     Motor: Motor function is intact.     ED Results / Procedures / Treatments   Labs (all labs ordered are listed, but only abnormal results are displayed) Labs Reviewed - No data to display  EKG None  Radiology No results found.  Procedures Procedures (including critical care time)  Medications Ordered in ED Medications  doxycycline (VIBRA-TABS) tablet 100 mg (has no administration in time range)    ED Course  I have reviewed the triage vital signs and the nursing notes.  Pertinent labs & imaging results that were available during my care of the  patient were reviewed by me and considered in my medical decision making (see chart for details).    MDM Rules/Calculators/A&P                         Patient presents with 2 slightly tender and swollen areas with mild erythema in inguinal and buttock region consistent with mild folliculitis.   Final Clinical Impression(s) / ED Diagnoses Final diagnoses:  Folliculitis    Rx / DC Orders ED Discharge Orders         Ordered    doxycycline (VIBRAMYCIN) 100 MG capsule  2 times daily     Discontinue  Reprint     04/08/20 0522           04/10/20, MD 04/08/20 [phone]

## 2020-05-12 ENCOUNTER — Encounter (HOSPITAL_BASED_OUTPATIENT_CLINIC_OR_DEPARTMENT_OTHER): Payer: Self-pay | Admitting: *Deleted

## 2020-05-12 ENCOUNTER — Other Ambulatory Visit: Payer: Self-pay

## 2020-05-12 ENCOUNTER — Emergency Department (HOSPITAL_BASED_OUTPATIENT_CLINIC_OR_DEPARTMENT_OTHER): Payer: Medicaid Other

## 2020-05-12 ENCOUNTER — Emergency Department (HOSPITAL_BASED_OUTPATIENT_CLINIC_OR_DEPARTMENT_OTHER)
Admission: EM | Admit: 2020-05-12 | Discharge: 2020-05-12 | Disposition: A | Discharge status: Home / Self Care | Payer: Medicaid Other | Attending: Emergency Medicine | Admitting: Emergency Medicine

## 2020-05-12 DIAGNOSIS — R439 Unspecified disturbances of smell and taste: Secondary | ICD-10-CM | POA: Insufficient documentation

## 2020-05-12 DIAGNOSIS — Z5321 Procedure and treatment not carried out due to patient leaving prior to being seen by health care provider: Secondary | ICD-10-CM | POA: Diagnosis not present

## 2020-05-12 DIAGNOSIS — R05 Cough: Secondary | ICD-10-CM | POA: Insufficient documentation

## 2020-05-12 DIAGNOSIS — R0981 Nasal congestion: Secondary | ICD-10-CM | POA: Insufficient documentation

## 2020-05-12 NOTE — ED Triage Notes (Signed)
Cough x 4 days  Loss of taste last pm, congestion, denies body aches no loss of smell

## 2021-01-26 ENCOUNTER — Other Ambulatory Visit: Payer: Self-pay

## 2021-01-26 ENCOUNTER — Encounter (HOSPITAL_BASED_OUTPATIENT_CLINIC_OR_DEPARTMENT_OTHER): Payer: Self-pay | Admitting: Emergency Medicine

## 2021-01-26 ENCOUNTER — Emergency Department (HOSPITAL_BASED_OUTPATIENT_CLINIC_OR_DEPARTMENT_OTHER)
Admission: EM | Admit: 2021-01-26 | Discharge: 2021-01-26 | Disposition: A | Discharge status: Home / Self Care | Payer: Medicaid Other | Attending: Emergency Medicine | Admitting: Emergency Medicine

## 2021-01-26 DIAGNOSIS — R5383 Other fatigue: Secondary | ICD-10-CM | POA: Diagnosis present

## 2021-01-26 DIAGNOSIS — Z5321 Procedure and treatment not carried out due to patient leaving prior to being seen by health care provider: Secondary | ICD-10-CM | POA: Insufficient documentation

## 2021-01-26 LAB — URINALYSIS, ROUTINE W REFLEX MICROSCOPIC
Bilirubin Urine: NEGATIVE
Glucose, UA: NEGATIVE mg/dL
Ketones, ur: NEGATIVE mg/dL
Leukocytes,Ua: NEGATIVE
Nitrite: NEGATIVE
Protein, ur: NEGATIVE mg/dL
Specific Gravity, Urine: 1.015 (ref 1.005–1.030)
pH: 7 (ref 5.0–8.0)

## 2021-01-26 LAB — URINALYSIS, MICROSCOPIC (REFLEX)

## 2021-01-26 LAB — PREGNANCY, URINE: Preg Test, Ur: NEGATIVE

## 2021-01-26 LAB — CBG MONITORING, ED: Glucose-Capillary: 110 mg/dL — ABNORMAL HIGH (ref 70–99)

## 2021-01-26 NOTE — ED Triage Notes (Addendum)
Fatigue, numbness to both hands, generalized body aches x 3 days. Endorses nausea. States she is concerned about her blood sugar.
